# Patient Record
Sex: Male | Born: 2000 | Race: White | Hispanic: No | Marital: Single | Smoking: Never smoker
Health system: Southern US, Community
[De-identification: ages and names within clinical notes are randomized; demographics above are authoritative.]

## PROBLEM LIST (undated history)

## (undated) HISTORY — PX: HERNIA REPAIR: SHX51

## (undated) HISTORY — PX: TONSILLECTOMY: SUR1361

---
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
## 2019-11-14 ENCOUNTER — Encounter (HOSPITAL_COMMUNITY): Payer: Self-pay | Admitting: Emergency Medicine

## 2019-11-14 ENCOUNTER — Emergency Department (HOSPITAL_COMMUNITY): Payer: No Typology Code available for payment source

## 2019-11-14 ENCOUNTER — Emergency Department (HOSPITAL_COMMUNITY)
Admission: EM | Admit: 2019-11-14 | Discharge: 2019-11-15 | Disposition: A | Discharge status: Home / Self Care | Payer: No Typology Code available for payment source | Attending: Emergency Medicine | Admitting: Emergency Medicine

## 2019-11-14 DIAGNOSIS — R000 Tachycardia, unspecified: Secondary | ICD-10-CM | POA: Diagnosis not present

## 2019-11-14 DIAGNOSIS — M79651 Pain in right thigh: Secondary | ICD-10-CM | POA: Insufficient documentation

## 2019-11-14 DIAGNOSIS — R0789 Other chest pain: Secondary | ICD-10-CM | POA: Insufficient documentation

## 2019-11-14 DIAGNOSIS — M79604 Pain in right leg: Secondary | ICD-10-CM | POA: Diagnosis not present

## 2019-11-14 DIAGNOSIS — R109 Unspecified abdominal pain: Secondary | ICD-10-CM | POA: Diagnosis not present

## 2019-11-14 DIAGNOSIS — Z23 Encounter for immunization: Secondary | ICD-10-CM | POA: Diagnosis not present

## 2019-11-14 DIAGNOSIS — M79605 Pain in left leg: Secondary | ICD-10-CM | POA: Insufficient documentation

## 2019-11-14 DIAGNOSIS — S20212A Contusion of left front wall of thorax, initial encounter: Secondary | ICD-10-CM

## 2019-11-14 MED ORDER — FENTANYL CITRATE (PF) 100 MCG/2ML IJ SOLN
50.0000 ug | Freq: Once | INTRAMUSCULAR | Status: AC
  Administered 2019-11-15: 50 ug via INTRAVENOUS
  Filled 2019-11-14: qty 2

## 2019-11-14 MED ORDER — TETANUS-DIPHTH-ACELL PERTUSSIS 5-2.5-18.5 LF-MCG/0.5 IM SUSP
0.5000 mL | Freq: Once | INTRAMUSCULAR | Status: AC
  Administered 2019-11-15: 0.5 mL via INTRAMUSCULAR
  Filled 2019-11-14: qty 0.5

## 2019-11-14 MED ORDER — SODIUM CHLORIDE 0.9 % IV BOLUS
1000.0000 mL | Freq: Once | INTRAVENOUS | Status: AC
  Administered 2019-11-15: 1000 mL via INTRAVENOUS
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
## 2019-11-15 ENCOUNTER — Emergency Department (HOSPITAL_COMMUNITY): Payer: No Typology Code available for payment source

## 2019-11-15 DIAGNOSIS — R0789 Other chest pain: Secondary | ICD-10-CM | POA: Diagnosis not present

## 2019-11-15 LAB — CBC
HCT: 43.6 % (ref 39.0–52.0)
Hemoglobin: 14.1 g/dL (ref 13.0–17.0)
MCH: 29.3 pg (ref 26.0–34.0)
MCHC: 32.3 g/dL (ref 30.0–36.0)
MCV: 90.5 fL (ref 80.0–100.0)
Platelets: 366 10*3/uL (ref 150–400)
RBC: 4.82 MIL/uL (ref 4.22–5.81)
RDW: 12.9 % (ref 11.5–15.5)
WBC: 21.6 10*3/uL — ABNORMAL HIGH (ref 4.0–10.5)
nRBC: 0 % (ref 0.0–0.2)

## 2019-11-15 LAB — COMPREHENSIVE METABOLIC PANEL
ALT: 49 U/L — ABNORMAL HIGH (ref 0–44)
AST: 41 U/L (ref 15–41)
Albumin: 4.1 g/dL (ref 3.5–5.0)
Alkaline Phosphatase: 82 U/L (ref 38–126)
Anion gap: 9 (ref 5–15)
BUN: 13 mg/dL (ref 6–20)
CO2: 25 mmol/L (ref 22–32)
Calcium: 9.4 mg/dL (ref 8.9–10.3)
Chloride: 109 mmol/L (ref 98–111)
Creatinine, Ser: 0.94 mg/dL (ref 0.61–1.24)
GFR calc Af Amer: >60 mL/min (ref >60)
GFR calc non Af Amer: >60 mL/min (ref >60)
Glucose, Bld: 131 mg/dL — ABNORMAL HIGH (ref 70–99)
Potassium: 4 mmol/L (ref 3.5–5.1)
Sodium: 143 mmol/L (ref 135–145)
Total Bilirubin: 0.5 mg/dL (ref 0.3–1.2)
Total Protein: 6.8 g/dL (ref 6.5–8.1)

## 2019-11-15 LAB — I-STAT CHEM 8, ED
BUN: 14 mg/dL (ref 6–20)
Calcium, Ion: 1.14 mmol/L — ABNORMAL LOW (ref 1.15–1.40)
Chloride: 108 mmol/L (ref 98–111)
Creatinine, Ser: 0.7 mg/dL (ref 0.61–1.24)
Glucose, Bld: 129 mg/dL — ABNORMAL HIGH (ref 70–99)
HCT: 42 % (ref 39.0–52.0)
Hemoglobin: 14.3 g/dL (ref 13.0–17.0)
Potassium: 4 mmol/L (ref 3.5–5.1)
Sodium: 145 mmol/L (ref 135–145)
TCO2: 25 mmol/L (ref 22–32)

## 2019-11-15 MED ORDER — METHOCARBAMOL 500 MG PO TABS
500.0000 mg | ORAL_TABLET | Freq: Three times a day (TID) | ORAL | 0 refills | Status: DC | PRN
Start: 2019-11-15 — End: 2020-09-30

## 2019-11-15 MED ORDER — NAPROXEN 500 MG PO TABS
500.0000 mg | ORAL_TABLET | Freq: Two times a day (BID) | ORAL | 0 refills | Status: DC | PRN
Start: 2019-11-15 — End: 2020-09-30

## 2019-11-15 MED ORDER — SODIUM CHLORIDE 0.9 % IV BOLUS
1000.0000 mL | Freq: Once | INTRAVENOUS | Status: AC
  Administered 2019-11-15: 1000 mL via INTRAVENOUS

## 2019-11-15 MED ORDER — IOHEXOL 300 MG/ML  SOLN
100.0000 mL | Freq: Once | INTRAMUSCULAR | Status: AC | PRN
  Administered 2019-11-15: 100 mL via INTRAVENOUS

## 2019-11-15 MED ORDER — KETOROLAC TROMETHAMINE 15 MG/ML IJ SOLN
15.0000 mg | Freq: Once | INTRAMUSCULAR | Status: AC
  Administered 2019-11-15: 15 mg via INTRAVENOUS
  Filled 2019-11-15: qty 1
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
## 2019-11-15 NOTE — ED Notes (Signed)
Patient verbalizes understanding of discharge instructions. Opportunity for questioning and answers were provided. Pt discharged from ED. 

## 2020-03-12 ENCOUNTER — Other Ambulatory Visit: Payer: Self-pay

## 2020-03-12 ENCOUNTER — Ambulatory Visit (INDEPENDENT_AMBULATORY_CARE_PROVIDER_SITE_OTHER): Payer: Medicaid Other | Admitting: Behavioral Health

## 2020-03-12 DIAGNOSIS — F4311 Post-traumatic stress disorder, acute: Secondary | ICD-10-CM
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
## 2020-04-14 ENCOUNTER — Ambulatory Visit (INDEPENDENT_AMBULATORY_CARE_PROVIDER_SITE_OTHER): Payer: Medicaid Other | Admitting: Licensed Clinical Social Worker

## 2020-04-14 ENCOUNTER — Other Ambulatory Visit: Payer: Self-pay

## 2020-04-14 DIAGNOSIS — F4323 Adjustment disorder with mixed anxiety and depressed mood: Secondary | ICD-10-CM
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
## 2020-06-05 ENCOUNTER — Other Ambulatory Visit: Payer: Self-pay

## 2020-06-05 ENCOUNTER — Ambulatory Visit (INDEPENDENT_AMBULATORY_CARE_PROVIDER_SITE_OTHER): Payer: Medicaid Other | Admitting: Licensed Clinical Social Worker

## 2020-06-05 DIAGNOSIS — F4323 Adjustment disorder with mixed anxiety and depressed mood: Secondary | ICD-10-CM | POA: Diagnosis not present
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
## 2020-07-07 ENCOUNTER — Ambulatory Visit (HOSPITAL_COMMUNITY): Payer: Self-pay | Admitting: Licensed Clinical Social Worker

## 2020-08-11 ENCOUNTER — Ambulatory Visit (INDEPENDENT_AMBULATORY_CARE_PROVIDER_SITE_OTHER): Payer: Medicaid Other | Admitting: Licensed Clinical Social Worker

## 2020-08-11 ENCOUNTER — Other Ambulatory Visit: Payer: Self-pay

## 2020-08-11 DIAGNOSIS — F330 Major depressive disorder, recurrent, mild: Secondary | ICD-10-CM

## 2020-08-11 DIAGNOSIS — F4323 Adjustment disorder with mixed anxiety and depressed mood: Secondary | ICD-10-CM

## 2020-08-11 DIAGNOSIS — F411 Generalized anxiety disorder: Secondary | ICD-10-CM | POA: Diagnosis not present

## 2020-08-11 NOTE — Progress Notes (Signed)
   THERAPIST PROGRESS NOTE  Session Time: 58  Therapist Response:    Subjective/Objective: Pt was alert and oriented x 5. He was dressed casually and engaged well in therapy session. Pt presented with flat, slowed, and depressed mood/affect. He engaged well and was cooperative.   Pt started out session with pros/cons list he had created of why he wants to continue in therapy. Pt reports that there are more pros than cons and wants to continue. He stated he has a goal of being self-sufficient with the objective of obtaining the driver's license. He states that he has not been practicing since his last session which was assigned to him as a task to work on.    Assessment/Plan:  Pt endorse symptoms for worthlessness, fatigue, increase in appetite, tension, worry, and restlessness. He meets criteria for MDD and GAD. He is not taking medication currently. LCSW referred pt to Medication provider at [facility] on June 17th at 1pm. Plan to read driving book for passing driver test 1 x weekly, pt to walk 1 x weekly for 15 minutes, Pt to attempt 1 driving lesson in car with supervision 1 x in next 4 weeks  Participation Level: Active  Behavioral Response: Casual and Fairly GroomedAlertAnxious and Depressed  Type of Therapy: Individual Therapy  Treatment Goals addressed: Diagnosis: Adjustment disorder   Interventions: CBT and Supportive  Summary: Ralph [name] is a 20 y.o. male who presents with adjustment disorder.   Suicidal/Homicidal: NAwithout intent/plan    Plan: Return again in 6  weeks.      [name], LCSW 08/11/2020

## 2020-09-02 ENCOUNTER — Ambulatory Visit (HOSPITAL_COMMUNITY): Payer: Medicaid Other | Admitting: Licensed Clinical Social Worker

## 2020-09-18 ENCOUNTER — Ambulatory Visit (HOSPITAL_COMMUNITY): Payer: Medicaid Other | Admitting: Physician Assistant

## 2020-09-30 ENCOUNTER — Other Ambulatory Visit: Payer: Self-pay

## 2020-09-30 ENCOUNTER — Ambulatory Visit (INDEPENDENT_AMBULATORY_CARE_PROVIDER_SITE_OTHER): Payer: Medicaid Other | Admitting: Psychiatry

## 2020-09-30 ENCOUNTER — Encounter (HOSPITAL_COMMUNITY): Payer: Self-pay | Admitting: Psychiatry

## 2020-09-30 DIAGNOSIS — F330 Major depressive disorder, recurrent, mild: Secondary | ICD-10-CM | POA: Diagnosis not present

## 2020-09-30 DIAGNOSIS — F40248 Other situational type phobia: Secondary | ICD-10-CM | POA: Diagnosis not present

## 2020-09-30 MED ORDER — SERTRALINE HCL 50 MG PO TABS: ORAL_TABLET | ORAL | 1 refills | Status: DC
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
## 2020-10-06 ENCOUNTER — Ambulatory Visit (HOSPITAL_COMMUNITY): Payer: Self-pay | Admitting: Licensed Clinical Social Worker

## 2020-10-20 ENCOUNTER — Other Ambulatory Visit: Payer: Self-pay

## 2020-10-20 ENCOUNTER — Ambulatory Visit (INDEPENDENT_AMBULATORY_CARE_PROVIDER_SITE_OTHER): Payer: Medicaid Other | Admitting: Licensed Clinical Social Worker

## 2020-10-20 DIAGNOSIS — F330 Major depressive disorder, recurrent, mild: Secondary | ICD-10-CM | POA: Diagnosis not present
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
## 2020-11-19 ENCOUNTER — Other Ambulatory Visit: Payer: Self-pay

## 2020-11-19 ENCOUNTER — Encounter (HOSPITAL_COMMUNITY): Payer: Self-pay | Admitting: Physician Assistant

## 2020-11-19 ENCOUNTER — Ambulatory Visit (INDEPENDENT_AMBULATORY_CARE_PROVIDER_SITE_OTHER): Payer: Medicaid Other | Admitting: Physician Assistant

## 2020-11-19 VITALS — BP 111/70 | HR 77 | Ht 72.0 in | Wt 300.0 lb

## 2020-11-19 DIAGNOSIS — F40248 Other situational type phobia: Secondary | ICD-10-CM

## 2020-11-19 DIAGNOSIS — F330 Major depressive disorder, recurrent, mild: Secondary | ICD-10-CM | POA: Diagnosis not present

## 2020-11-19 NOTE — Progress Notes (Signed)
BH MD/PA/NP OP Progress Note  01/01/2021 10:54 PM [name]  MRN:  [id number]  Chief Complaint:  Chief Complaint   Medication Management   Follow up and medication management  HPI:   Ralph [name]   Visit Diagnosis:    ICD-10-CM   1. Other situational type phobia  F40.248     2. Major depressive disorder, recurrent episode, mild (HCC)  F33.0       Past Psychiatric History:  Major depressive disorder Other situational phobia  Past Medical History: No past medical history on file.  Past Surgical History:  Procedure Laterality Date   HERNIA REPAIR     age2   TONSILLECTOMY     age 41    Family Psychiatric History:  Denied  Family History: No family history on file.  Social History:  Social History   Socioeconomic History   Marital status: Single    Spouse name: Not on file   Number of children: Not on file   Years of education: Not on file   Highest education level: Not on file  Occupational History   Not on file  Tobacco Use   Smoking status: Never   Smokeless tobacco: Never  Substance and Sexual Activity   Alcohol use: No   Drug use: No   Sexual activity: Not on file  Other Topics Concern   Not on file  Social History Narrative   Not on file   Social Determinants of Health   Financial Resource Strain: Low Risk    Difficulty of Paying Living Expenses: Not very hard  Food Insecurity: No Food Insecurity   Worried About Running Out of Food in the Last Year: Never true   Ran Out of Food in the Last Year: Never true  Transportation Needs: No Transportation Needs   Lack of Transportation (Medical): No   Lack of Transportation (Non-Medical): No  Physical Activity: Insufficiently Active   Days of Exercise per Week: 2 days   Minutes of Exercise per Session: 20 min  Stress: No Stress Concern Present   Feeling of Stress : Only a little  Social Connections: Socially Isolated   Frequency of Communication with Friends and Family: Twice a week    Frequency of Social Gatherings with Friends and Family: Once a week   Attends Religious Services: Never   Database administrator or Organizations: No   Attends Banker Meetings: Never   Marital Status: Never married    Allergies:  Allergies  Allergen Reactions   Apple Diarrhea and Rash    Apple juice    Bean Pod Extract Diarrhea, Rash and Other (See Comments)    Green beans    Metabolic Disorder Labs: No results found for: HGBA1C, MPG No results found for: PROLACTIN No results found for: CHOL, TRIG, HDL, CHOLHDL, VLDL, LDLCALC No results found for: TSH  Therapeutic Level Labs: No results found for: LITHIUM No results found for: VALPROATE No components found for:  CBMZ  Current Medications: Current Outpatient Medications  Medication Sig Dispense Refill   cetirizine (ZYRTEC) 10 MG tablet Take 10 mg by mouth daily.     sertraline (ZOLOFT) 50 MG tablet TAKE 1/2 (ONE-HALF) TABLET BY MOUTH IN THE MORNING FOR  1  WEEK  THEN  TAKE  1  TABLET IN THE MORNING 30 tablet 0   No current facility-administered medications for this visit.     Musculoskeletal: Strength & Muscle Tone: within normal limits Gait & Station: normal Patient leans: N/A  Psychiatric Specialty  Exam: Review of Systems  Psychiatric/Behavioral:  Negative for decreased concentration, dysphoric mood, hallucinations, self-injury, sleep disturbance and suicidal ideas. The patient is nervous/anxious. The patient is not hyperactive.    Blood pressure 111/70, pulse 77, height 6' (1.829 m), weight 300 lb (136.1 kg).Body mass index is 40.69 kg/m.  General Appearance: Well Groomed  Eye Contact:  Good  Speech:  Clear and Coherent and Normal Rate  Volume:  Normal  Mood:  Anxious and Euthymic  Affect:  Appropriate and Congruent  Thought Process:  Coherent and Descriptions of Associations: Intact  Orientation:  Full (Time, Place, and Person)  Thought Content: WDL   Suicidal Thoughts:  No  Homicidal  Thoughts:  No  Memory:  Immediate;   Good Recent;   Good Remote;   Good  Judgement:  Good  Insight:  Fair  Psychomotor Activity:  Normal  Concentration:  Concentration: Good and Attention Span: Good  Recall:  Good  Fund of Knowledge: Good  Language: Good  Akathisia:  Negative  Handed:  Right  AIMS (if indicated): not done  Assets:  Communication Skills Desire for Improvement Financial Resources/Insurance Housing Physical Health Social Support Vocational/Educational  ADL's:  Intact  Cognition: WNL  Sleep:  Good   Screenings: GAD-7    Flowsheet Row Clinical Support from 11/19/2020 in [facility] Office Visit from 09/30/2020 in [facility] Counselor from 08/11/2020 in [facility] Office Visit from 09/25/2017 in [facility] Office Visit from 09/04/2017 in [facility]  Total GAD-7 Score 8 2 8 8 6       PHQ2-9    Flowsheet Row Clinical Support from 11/19/2020 in [facility] Office Visit from 09/30/2020 in [facility] Counselor from 08/11/2020 in [facility] Office Visit from 09/25/2017 in [facility] Office Visit from 09/04/2017 in [facility]  PHQ-2 Total Score 2 3 2 3 3   PHQ-9 Total Score 5 8 6 6 6       Flowsheet Row Clinical Support from 11/19/2020 in [facility] Office Visit from 09/30/2020 in [facility] Counselor from 08/11/2020 in [facility]  C-SSRS RISK CATEGORY No Risk No Risk No Risk        Assessment and Plan:     1. Other situational type phobia Patient to continue taking sertraline 50 mg daily for the management of his situational phobia  2. Major depressive disorder, recurrent episode, mild (HCC) Patient to continue taking  sertraline 50 mg daily for the management of his major depressive disorder  Patient to follow up in 2 months Provider spent a total of 20 minutes with the patient/reviewing patient's chart  [facility], PA 01/01/2021, 10:54 PM

## 2020-12-15 ENCOUNTER — Other Ambulatory Visit: Payer: Self-pay

## 2020-12-15 ENCOUNTER — Ambulatory Visit (INDEPENDENT_AMBULATORY_CARE_PROVIDER_SITE_OTHER): Payer: Medicaid Other | Admitting: Licensed Clinical Social Worker

## 2020-12-15 DIAGNOSIS — F330 Major depressive disorder, recurrent, mild: Secondary | ICD-10-CM | POA: Diagnosis not present

## 2020-12-15 DIAGNOSIS — F411 Generalized anxiety disorder: Secondary | ICD-10-CM

## 2020-12-15 NOTE — Progress Notes (Signed)
   THERAPIST PROGRESS NOTE  Session Time: 11  Participation Level: Active  Behavioral Response: Casual and Fairly GroomedAlertAnxious and Depressed  Type of Therapy: Individual Therapy  Treatment Goals addressed: Diagnosis: MDD and GAD  Interventions: Motivational Interviewing and Supportive  Summary: Ralph [name] is a 20 y.o. male who presents with depressed and anxious mood\affect.  Patient was pleasant, cooperative, and maintained good eye contact.  [name] was alert and oriented x5.  He was dressed casually and was well-groomed.  Primary stressors for patient are family illness, driving, and work.  Patient started off session by stating that his mother recently got surgery on her hammertoe.  He reports that this has been a struggle as his mom has recovering she has not been working.  [name] states that this is his main source for driving as he still is not able to drive.  LCSW inquired about his driving lessons.  He states that he has not been able to practice in the last 60 to 90 days since seeing LCSW last.  LCSW utilized motivational interviewing and supportive therapy for encouragement, praise, and motivation.  This is helped patient realized that he has the ability and not the inability to drive.  LCSW stated "you missed 100% of the shots that you do not take" and "failures or your lessons for your successes".  [name] reported relief from exercise stating he came in at about a 5 and is leaving and a 7.   [name] reports that work is also been a stressor for him.  He reports that a lot of kids have been starting to go back to school this puts more stress on him and more responsibility on him for his work duties.  This is due to being short staffed.  LCSW view worked with patient about focusing on what we can control versus what we cannot control.  LCSW had patient write down his job duties and told him to focus on what was job duties along as that is what he is hired to  do.  Suicidal/Homicidal: Nowithout intent/plan  Therapist Response:    Intervention/Plan: LCSW spoke to patient about missing appointments as he has missed 2.  LCSW encourage patient and praised him to maintain appointments and also coming back after missing 2 appointments.  LCSW utilized motivational interviewing, cognitive behavioral therapy and supportive therapy.  Open-ended questions were utilized.  Unconditional positive regard was utilized.  Praise and support was also utilized in today's session.  Plan for patient moving forward is to follow-up with LCSW every 4 weeks.  Patient to practice driving at least 1 time per month.  Patient to write down 3 positive affirmations 1 time per week and utilize them when he feels discouraged.  Treatment plan updated today to reflect new plan.   Plan: Return again in 4 weeks.      [name], LCSW 12/15/2020

## 2020-12-29 ENCOUNTER — Other Ambulatory Visit (HOSPITAL_COMMUNITY): Payer: Self-pay | Admitting: Psychiatry

## 2020-12-29 DIAGNOSIS — F40248 Other situational type phobia: Secondary | ICD-10-CM

## 2020-12-29 DIAGNOSIS — F330 Major depressive disorder, recurrent, mild: Secondary | ICD-10-CM

## 2021-01-01 ENCOUNTER — Encounter (HOSPITAL_COMMUNITY): Payer: Self-pay | Admitting: Physician Assistant

## 2021-01-19 ENCOUNTER — Ambulatory Visit (INDEPENDENT_AMBULATORY_CARE_PROVIDER_SITE_OTHER): Payer: Medicaid Other | Admitting: Licensed Clinical Social Worker

## 2021-01-19 ENCOUNTER — Encounter (HOSPITAL_COMMUNITY): Payer: Self-pay | Admitting: Physician Assistant

## 2021-01-19 ENCOUNTER — Other Ambulatory Visit: Payer: Self-pay

## 2021-01-19 ENCOUNTER — Ambulatory Visit (INDEPENDENT_AMBULATORY_CARE_PROVIDER_SITE_OTHER): Payer: Medicaid Other | Admitting: Physician Assistant

## 2021-01-19 DIAGNOSIS — F40248 Other situational type phobia: Secondary | ICD-10-CM | POA: Diagnosis not present

## 2021-01-19 DIAGNOSIS — F330 Major depressive disorder, recurrent, mild: Secondary | ICD-10-CM

## 2021-01-19 DIAGNOSIS — F411 Generalized anxiety disorder: Secondary | ICD-10-CM

## 2021-01-19 MED ORDER — SERTRALINE HCL 50 MG PO TABS: 50.0000 mg | ORAL_TABLET | Freq: Every day | ORAL | 2 refills | Status: DC
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
## 2021-02-16 ENCOUNTER — Ambulatory Visit (HOSPITAL_COMMUNITY): Payer: Medicaid Other | Admitting: Licensed Clinical Social Worker

## 2021-03-15 ENCOUNTER — Telehealth (HOSPITAL_COMMUNITY): Payer: Self-pay | Admitting: Licensed Clinical Social Worker

## 2021-03-15 NOTE — Telephone Encounter (Signed)
LCSW called to confirm appt and pt confirmed f/u appt for 10am

## 2021-03-16 ENCOUNTER — Other Ambulatory Visit: Payer: Self-pay

## 2021-03-16 ENCOUNTER — Ambulatory Visit (INDEPENDENT_AMBULATORY_CARE_PROVIDER_SITE_OTHER): Payer: Medicaid Other | Admitting: Licensed Clinical Social Worker

## 2021-03-16 DIAGNOSIS — F411 Generalized anxiety disorder: Secondary | ICD-10-CM
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
## 2021-04-12 ENCOUNTER — Ambulatory Visit (INDEPENDENT_AMBULATORY_CARE_PROVIDER_SITE_OTHER): Payer: Medicaid Other | Admitting: Licensed Clinical Social Worker

## 2021-04-12 ENCOUNTER — Other Ambulatory Visit: Payer: Self-pay

## 2021-04-12 DIAGNOSIS — F411 Generalized anxiety disorder: Secondary | ICD-10-CM

## 2021-04-12 DIAGNOSIS — F330 Major depressive disorder, recurrent, mild: Secondary | ICD-10-CM
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
## 2021-04-20 ENCOUNTER — Other Ambulatory Visit: Payer: Self-pay

## 2021-04-20 ENCOUNTER — Encounter (HOSPITAL_COMMUNITY): Payer: Self-pay | Admitting: Family

## 2021-04-20 ENCOUNTER — Ambulatory Visit (HOSPITAL_COMMUNITY): Payer: Medicaid Other | Admitting: Family

## 2021-04-20 VITALS — BP 139/66 | HR 84 | Ht 73.0 in | Wt 318.0 lb

## 2021-04-20 DIAGNOSIS — F4323 Adjustment disorder with mixed anxiety and depressed mood: Secondary | ICD-10-CM

## 2021-04-20 DIAGNOSIS — F411 Generalized anxiety disorder: Secondary | ICD-10-CM

## 2021-04-20 DIAGNOSIS — F40248 Other situational type phobia: Secondary | ICD-10-CM

## 2021-04-20 DIAGNOSIS — F330 Major depressive disorder, recurrent, mild: Secondary | ICD-10-CM

## 2021-04-20 MED ORDER — SERTRALINE HCL 50 MG PO TABS: 50.0000 mg | ORAL_TABLET | Freq: Every day | ORAL | 0 refills | Status: DC

## 2021-04-20 NOTE — Progress Notes (Signed)
[place] MD/PA/NP OP Progress Note  04/20/2021 10:55 AM Ralph [name]  MRN:  [id number]  Chief Complaint:  [name] reported "  I am feeling really good."   HPI:  Ralph [name] was seen and evaluated face-to-face for medication management.  He has a charted history of major depressive disorder generalized anxiety disorder and adjustment disorder.  Currently he is prescribed Zoloft 50 mg mg daily which he reports taking and tolerating well.  Denying any suicidal or homicidal ideations.  Denies auditory or visual hallucinations.  Rates his depression 3 out of 10 with 10 being the worst.  States this is the best he is felt in a while.  States he is currently unemployed by a Social worker. 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  Reports he and his family are in the midst of buying a new home to be closer to their grandmother.  Patient reports he is excited to move to the new neighborhood.  Denies any other distressing symptoms.  He reports a good appetite.  States he is resting well throughout the night.  Patient to follow-up 3 months for medication management.  Support, encouragement  and reassurance was provided.  Visit Diagnosis:    ICD-10-CM   1. Major depressive disorder, recurrent episode, mild (HCC)  F33.0     2. Adjustment disorder with mixed anxiety and depressed mood  F43.23     3. GAD (generalized anxiety disorder)  F41.1       Past Psychiatric History:   Past Medical History: No past medical history on file.  Past Surgical History:  Procedure Laterality Date   HERNIA REPAIR     age2   TONSILLECTOMY     age 23    Family Psychiatric History:   Family History: No family history on file.  Social History:  Social History   Socioeconomic History   Marital status: Single    Spouse name: Not on file   Number of children: Not on file   Years of education: Not on file   Highest education level: Not on file  Occupational  History   Not on file  Tobacco Use   Smoking status: Never   Smokeless tobacco: Never  Substance and Sexual Activity   Alcohol use: No   Drug use: No   Sexual activity: Not on file  Other Topics Concern   Not on file  Social History Narrative   Not on file   Social Determinants of Health   Financial Resource Strain: Low Risk    Difficulty of Paying Living Expenses: Not very hard  Food Insecurity: No Food Insecurity   Worried About Running Out of Food in the Last Year: Never true   Ran Out of Food in the Last Year: Never true  Transportation Needs: No Transportation Needs   Lack of Transportation (Medical): No   Lack of Transportation (Non-Medical): No  Physical Activity: Insufficiently Active   Days of Exercise per Week: 2 days   Minutes of Exercise per Session: 20 min  Stress: No Stress Concern Present   Feeling of Stress : Only a little  Social Connections: Socially Isolated   Frequency of Communication with Friends and Family: Twice a week   Frequency of Social Gatherings with Friends and Family: Once a week   Attends Religious Services: Never   Marine scientist or Organizations: No   Attends Archivist Meetings: Never   Marital Status: Never married  Allergies:  Allergies  Allergen Reactions   Apple Diarrhea and Rash    Apple juice    Bean Pod Extract Diarrhea, Rash and Other (See Comments)    Green beans    Metabolic Disorder Labs: No results found for: HGBA1C, MPG No results found for: PROLACTIN No results found for: CHOL, TRIG, HDL, CHOLHDL, VLDL, LDLCALC No results found for: TSH  Therapeutic Level Labs: No results found for: LITHIUM No results found for: VALPROATE No components found for:  CBMZ  Current Medications: Current Outpatient Medications  Medication Sig Dispense Refill   cetirizine (ZYRTEC) 10 MG tablet Take 10 mg by mouth daily.     sertraline (ZOLOFT) 50 MG tablet Take 1 tablet (50 mg total) by mouth daily. 30 tablet  2   No current facility-administered medications for this visit.     Musculoskeletal: Strength & Muscle Tone: within normal limits Gait & Station: normal Patient leans: N/A  Psychiatric Specialty Exam: Review of Systems  Cardiovascular: Negative.   Musculoskeletal: Negative.   Psychiatric/Behavioral:  The patient is nervous/anxious.   All other systems reviewed and are negative.  There were no vitals taken for this visit.There is no height or weight on file to calculate BMI.  General Appearance: Casual  Eye Contact:  Good  Speech:  Clear and Coherent  Volume:  Normal  Mood:  Depressed  Affect:  Congruent  Thought Process:  Coherent  Orientation:  Full (Time, Place, and Person)  Thought Content: Logical   Suicidal Thoughts:  No  Homicidal Thoughts:  No  Memory:  Immediate;   Good Recent;   Good  Judgement:  Good  Insight:  Good  Psychomotor Activity:  Normal  Concentration:  Concentration: Good  Recall:  Good  Fund of Knowledge: Good  Language: Good  Akathisia:  No  Handed:  Right  AIMS (if indicated): done  Assets:  Communication Skills Desire for Improvement Social Support  ADL's:  Intact  Cognition: WNL  Sleep:  Fair   Screenings: GAD-7    Health and safety inspector from 04/12/2021 in [facility] Counselor from 03/16/2021 in [facility] Counselor from 01/19/2021 in [facility] Counselor from 12/15/2020 in [place] from 11/19/2020 in [facility]  Total GAD-7 Score 3 8 5 7 8       PHQ2-9    Flowsheet Row Counselor from 04/12/2021 in [facility] Counselor from 03/16/2021 in [facility] Counselor from 01/19/2021 in [facility] Counselor from 12/15/2020 in [place] from  11/19/2020 in [facility]  PHQ-2 Total Score 0 1 2 2 2   PHQ-9 Total Score 0 4 5 9 5       Flowsheet Row Counselor from 03/16/2021 in [facility] Office Visit from 01/19/2021 in [place] from 11/19/2020 in [place] No Risk No Risk No Risk        Assessment and Plan:  [name] is a 21 year old Caucasian male that presents with a history of adjustment disorder with mixed depression and anxiety.  Reporting overall his mood has been stabilized with medications.  States he initially started taking medication due to the passing of his grandfather while in his senior year in high school.  Reports he has been compliant and tolerating Zoloft well. He  denied illicit drug use or substance abuse history.  Patient to follow-up 3 months for medication management support encouragement reassurance was provided.  Adjustment disorder: Major depressive disorder: Generalized anxiety disorder:  Continue Zoloft 50 mg p.o. daily  Patient to follow-up 3 months for medication management   Derrill [place], NP 04/20/2021, 10:55 AM

## 2021-04-28 ENCOUNTER — Ambulatory Visit (INDEPENDENT_AMBULATORY_CARE_PROVIDER_SITE_OTHER): Payer: Medicaid Other | Admitting: Licensed Clinical Social Worker

## 2021-04-28 ENCOUNTER — Other Ambulatory Visit: Payer: Self-pay

## 2021-04-28 DIAGNOSIS — F411 Generalized anxiety disorder: Secondary | ICD-10-CM | POA: Diagnosis not present
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
## 2021-05-26 ENCOUNTER — Encounter (HOSPITAL_COMMUNITY): Payer: Self-pay

## 2021-05-26 ENCOUNTER — Ambulatory Visit (HOSPITAL_COMMUNITY): Payer: Medicaid Other | Admitting: Licensed Clinical Social Worker

## 2021-06-29 ENCOUNTER — Ambulatory Visit (INDEPENDENT_AMBULATORY_CARE_PROVIDER_SITE_OTHER): Payer: Medicaid Other | Admitting: Licensed Clinical Social Worker

## 2021-06-29 ENCOUNTER — Other Ambulatory Visit: Payer: Self-pay

## 2021-06-29 ENCOUNTER — Encounter (HOSPITAL_COMMUNITY): Payer: Self-pay

## 2021-06-29 DIAGNOSIS — F330 Major depressive disorder, recurrent, mild: Secondary | ICD-10-CM

## 2021-06-29 DIAGNOSIS — F411 Generalized anxiety disorder: Secondary | ICD-10-CM

## 2021-06-29 NOTE — Plan of Care (Signed)
Pt agreeable to plan  ?

## 2021-06-29 NOTE — Progress Notes (Signed)
? ?  THERAPIST PROGRESS NOTE ? ?Session Time: 40 ? ?Participation Level: Active ? ?Behavioral Response: CasualAlertAnxious and Depressed ? ?Type of Therapy: Individual Therapy ? ?Treatment Goals addressed: GAD-7 below 5 and PHQ-9 below 10  ? ?ProgressTowards Goals: Progressing ? ?Interventions: CBT, Motivational Interviewing, and Supportive ? ?Summary: Ralph [name] is a 21 y.o. male who presents with depressed and anxious mood\affect.  Patient was pleasant, cooperative, maintained good eye contact.  he engaged well in therapy session and was dressed casually. ? Reports primary stressors as financials, and housing.  Patient states that he has moved into new housing and that is why he canceled his last appointment as they were in the process of moving to [employer] which is still in [place].  Patient reports that there have been some updates to the house that they have been making and patient is responsible for a portion of the bills.  Patient reports that he has been stressed with financials but is unaware of all that he is responsible for as far as his responsibility towards the bills.  LCSW advised patient to sit down with his parents to talk about his responsibilities so that he can make a proper budget. ? ?Suicidal/Homicidal: Nowithout intent/plan ? ?Therapist Response:  ? ? Intervention/Plan: LCSW administered a PHQ-9 and GAD-7.  LCSW notes that patient has decreased both PHQ-9 and GAD-7 below a 5 for the past 2 sessions over the last 3 months.  Patient's goal\objective is below a 10 and a 5 respectively for PHQ-9 and GAD-7.  Interventions utilized in today's session were for supportive therapy for praise and encouragement.  LCSW used cognitive behavioral therapy to educate patient on budgeting utilizing the "50, 30, 20 rule".  This is a budgeting plan to spend 50% and needs, 30% on once, and 20% on savings.  Plan for patient is to bring and at least 3 things he would like to work on moving forward for  therapy.  ? ?Plan: Return again in 4 weeks. ? ?Diagnosis: No diagnosis found. ? ?Collaboration of Care:  ?Other None in todays session  ?Patient/Guardian was advised Release of Information must be obtained prior to any record release in order to collaborate their care with an outside provider. Patient/Guardian was advised if they have not already done so to contact the registration department to sign all necessary forms in order for [place] to release information regarding their care.  ? ?Consent: Patient/Guardian gives verbal consent for treatment and assignment of benefits for services provided during this visit. Patient/Guardian expressed understanding and agreed to proceed.  ? ?Dory Horn, LCSW ?06/29/2021 ? ?

## 2021-07-13 ENCOUNTER — Encounter (HOSPITAL_COMMUNITY): Payer: Self-pay | Admitting: Physician Assistant

## 2021-07-13 ENCOUNTER — Ambulatory Visit (INDEPENDENT_AMBULATORY_CARE_PROVIDER_SITE_OTHER): Payer: Medicaid Other | Admitting: Physician Assistant

## 2021-07-13 DIAGNOSIS — F40248 Other situational type phobia: Secondary | ICD-10-CM

## 2021-07-13 DIAGNOSIS — F330 Major depressive disorder, recurrent, mild: Secondary | ICD-10-CM

## 2021-07-13 MED ORDER — SERTRALINE HCL 50 MG PO TABS: 50.0000 mg | ORAL_TABLET | Freq: Every day | ORAL | 3 refills | Status: DC
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
## 2021-08-16 ENCOUNTER — Ambulatory Visit (INDEPENDENT_AMBULATORY_CARE_PROVIDER_SITE_OTHER): Payer: Medicaid Other | Admitting: Licensed Clinical Social Worker

## 2021-08-16 DIAGNOSIS — F330 Major depressive disorder, recurrent, mild: Secondary | ICD-10-CM

## 2021-08-16 DIAGNOSIS — F411 Generalized anxiety disorder: Secondary | ICD-10-CM

## 2021-08-16 NOTE — Progress Notes (Signed)
? ?  THERAPIST PROGRESS NOTE ? ?Session Time: 45 ? ?Participation Level: Active ? ?Behavioral Response: CasualAlertAnxious and Depressed ? ?Type of Therapy: Individual Therapy ? ?Treatment Goals addressed: [name] WILL ATTEND AT LEAST 80% OF SCHEDULED GROUP PSYCHOTHERAPY  ?SESSIONS ? ?ProgressTowards Goals: Progressing ? ?Interventions: CBT, Motivational Interviewing, and Supportive ? ?Summary: Ralph [name] is a 21 y.o. male who presents with depressed and anxious mood\affect.  Patient was pleasant, cooperative, maintained good eye contact.  He was alert and oriented x5. ? Comes in today with his grandmother.  Patient was agreeable to grandmother in session.  Grandmother started out session stating "I am concerned with his over all drive".  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  Patient reports that he understands grandma's concerns, however does not know how to address them.  LCSW attempted to bridge By mediating the situation.  Discussion was pleasant and progressive.  A close the gap between grandmas viewpoints and patient's viewpoints, as reported by both grandma and patient. ? ?Suicidal/Homicidal: Nowithout intent/plan ? ?Therapist Response:  ? ? Intervention/Plan: Plan for patient is referral to [employer] for support groups for mental health.  Patient to bring in 3 careers or job that interest him into next session. Pt also wants to discuss budgeting financials. LCSW administered motivational interview for positive affirmations, reflective listening, and open ended question. LCSW educated pt on the importance on maintaining medication regiment and therapy session as scheduled. LCSW used supportive therapy for praise and encouragement.  ? ?Plan: Return again in 4 weeks. ? ?Diagnosis: Major depressive disorder, recurrent episode, mild ([place]) ? ?GAD (generalized anxiety disorder) ? ?Collaboration of Care: Other None in  todays session.    ?     ?Patient/Guardian was advised Release of Information must be obtained prior to any record release in order to collaborate their care with an outside provider. Patient/Guardian was advised if they have not already done so to contact the registration department to sign all necessary forms in order for [place] to release information regarding their care.  ? ?Consent: Patient/Guardian gives verbal consent for treatment and assignment of benefits for services provided during this visit. Patient/Guardian expressed understanding and agreed to proceed.  ? ?Dory Horn, LCSW ?08/16/2021 ? ?

## 2021-08-16 NOTE — Plan of Care (Signed)
?  Problem: Depression CCP Problem  1 Adjustment disorder  ?Goal: STG: Ralph [name] WILL ATTEND AT LEAST 80% OF SCHEDULED GROUP PSYCHOTHERAPY SESSIONS ?Outcome: Progressing ?Goal: STG: [name] WILL COMPLETE AT LEAST 80% OF ASSIGNED HOMEWORK ?Outcome: Progressing ?  ?Problem: Depression CCP Problem  1 Adjustment disorder  ?Goal:  walk 3 x weekly  ?Outcome: Not Progressing ?Goal: Decrease GAD-7 below 5  ?Outcome: Not Progressing ?Goal: LTG: Ralph [name] WILL SCORE LESS THAN 10 ON THE PATIENT HEALTH QUESTIONNAIRE (PHQ-9) ?Outcome: Not Progressing ?  ?Problem: Depression CCP Problem  1 Adjustment disorder  ?Intervention: WORK WITH Ralph [name] TO TRACK SYMPTOMS, TRIGGERS AND/OR SKILL USE THROUGH A MOOD CHART, DIARY CARD, OR JOURNAL ?Intervention: Ralph [name] TO PARTICIPATE IN RECOV[employer] PEER SUPPORT ACTIVITIES WEEKLY ?Intervention: PROVIDE Ralph [name] WITH EDUCATIONAL INFORMATION AND READING MATERIAL ON DISSOCIATION, ITS CAUSES, AND SYMPTOMS ?  ?

## 2021-08-26 ENCOUNTER — Telehealth (HOSPITAL_COMMUNITY): Payer: Self-pay | Admitting: Licensed Clinical Social Worker

## 2021-08-26 NOTE — Telephone Encounter (Signed)
LCSW called patient as he left message for administrative staff for referral to [employer].  LCSW recent referral and provided patient with phone number to follow-up Monday afternoon if no callback is received by then.

## 2021-09-07 ENCOUNTER — Encounter: Payer: Self-pay | Admitting: *Deleted

## 2021-09-14 ENCOUNTER — Ambulatory Visit (INDEPENDENT_AMBULATORY_CARE_PROVIDER_SITE_OTHER): Payer: Medicaid Other | Admitting: Licensed Clinical Social Worker

## 2021-09-14 DIAGNOSIS — F330 Major depressive disorder, recurrent, mild: Secondary | ICD-10-CM

## 2021-09-14 DIAGNOSIS — F411 Generalized anxiety disorder: Secondary | ICD-10-CM

## 2021-09-14 NOTE — Progress Notes (Signed)
   THERAPIST PROGRESS NOTE  Session Time: 45  Participation Level: Active  Behavioral Response: CasualAlertAnxious and Depressed  Type of Therapy: Individual Therapy  Treatment Goals addressed: Decrease PHQ-9 and GAD-7 below 5 and 10   ProgressTowards Goals: Met  Interventions: CBT, Motivational Interviewing, and Strength-based  Summary: Ralph [name] is a 21 y.o. male who presents with depressed and anxious mood\affect.  Patient was pleasant, cooperative, maintained good eye contact.  He engaged well in therapy session and was dressed casually.   Patient reports primary stressors as work, family conflict, and financials.  Patient reports that he has been being proactive about searching for further opportunities in his life for financials and work.  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  Patient reports that he believes there is a second interview but has not gotten a call back as of today.  Patient reports optimism due to the fact that his interview was conducted yesterday.  LCSW advocated for patient to follow-up with interviewing manager to show appreciation for the opportunity to sit down and speak about the position.  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  Patient reports that he did receive a call back in regards to referral sent out last session.  Patient states that there were forms that were needed to be completed and they were completed and sent back last week Monday.  LCSW encouraged patient to follow-up with referral this week.    Suicidal/Homicidal: Nowithout intent/plan  Therapist Response:    Interventions utilized in today's session were for solution focused for resources to [employer] works for employment opportunities.  LCSW used unconditional positive regard, words of affirmation, positive reinforcement, and  reflective listening in today's session.  LCSW utilized supportive therapy for praise and encouragement.  LCSW used motivational interviewing for open-ended questions.  Plan: Return again in 4 weeks.  Diagnosis: Major depressive disorder, recurrent episode, mild (HCC)  GAD (generalized anxiety disorder)  Collaboration of Care: Other Pt to f/u with [employer] for Support Groups.   Patient/Guardian was advised Release of Information must be obtained prior to any record release in order to collaborate their care with an outside provider. Patient/Guardian was advised if they have not already done so to contact the registration department to sign all necessary forms in order for [place] to release information regarding their care.   Consent: Patient/Guardian gives verbal consent for treatment and assignment of benefits for services provided during this visit. Patient/Guardian expressed understanding and agreed to proceed.   Dory Horn, LCSW 09/14/2021

## 2021-09-14 NOTE — Plan of Care (Signed)
  Problem: Depression CCP Problem  1 Adjustment disorder  Goal:  walk 3 x weekly  Outcome: Progressing Goal: STG: Ralph [name] WILL COMPLETE AT LEAST 80% OF ASSIGNED HOMEWORK Outcome: Progressing   Problem: Depression CCP Problem  1 Adjustment disorder  Goal: STG: Ralph [name] WILL ATTEND AT LEAST 80% OF SCHEDULED GROUP PSYCHOTHERAPY SESSIONS Outcome: Not Progressing   Problem: Depression CCP Problem  1 Adjustment disorder  Goal: Decrease GAD-7 below 5  Outcome: Completed/Met Goal: LTG: Ralph [name] WILL SCORE LESS THAN 10 ON THE PATIENT HEALTH QUESTIONNAIRE (PHQ-9) Outcome: Completed/Met   Problem: Depression CCP Problem  1 Adjustment disorder  Intervention: Administer the PHQ-9 or MADRS weekly for 4 weeks Intervention: WORK WITH Ralph [name] TO IDENTIFY THE MAJOR COMPONENTS OF A RECENT EPISODE OF DEPRESSION: PHYSICAL SYMPTOMS, MAJOR THOUGHTS AND IMAGES, AND MAJOR BEHAVIORS THEY EXPERIENCED

## 2021-10-06 ENCOUNTER — Ambulatory Visit (HOSPITAL_COMMUNITY): Payer: Medicaid Other | Admitting: Licensed Clinical Social Worker

## 2021-10-12 ENCOUNTER — Encounter (HOSPITAL_COMMUNITY): Payer: Medicaid Other | Admitting: Physician Assistant

## 2021-10-18 ENCOUNTER — Encounter (HOSPITAL_COMMUNITY): Payer: Self-pay | Admitting: Student in an Organized Health Care Education/Training Program

## 2021-10-18 ENCOUNTER — Ambulatory Visit (INDEPENDENT_AMBULATORY_CARE_PROVIDER_SITE_OTHER): Payer: Medicaid Other | Admitting: Student in an Organized Health Care Education/Training Program

## 2021-10-18 DIAGNOSIS — F40248 Other situational type phobia: Secondary | ICD-10-CM | POA: Diagnosis not present

## 2021-10-18 DIAGNOSIS — F330 Major depressive disorder, recurrent, mild: Secondary | ICD-10-CM

## 2021-10-18 MED ORDER — SERTRALINE HCL 50 MG PO TABS: 50.0000 mg | ORAL_TABLET | Freq: Every day | ORAL | 3 refills | Status: AC
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
## 2021-11-02 ENCOUNTER — Ambulatory Visit (INDEPENDENT_AMBULATORY_CARE_PROVIDER_SITE_OTHER): Payer: Medicaid Other | Admitting: Licensed Clinical Social Worker

## 2021-11-02 DIAGNOSIS — F330 Major depressive disorder, recurrent, mild: Secondary | ICD-10-CM

## 2021-11-02 DIAGNOSIS — F411 Generalized anxiety disorder: Secondary | ICD-10-CM

## 2021-11-02 NOTE — Plan of Care (Signed)
  Problem: Depression CCP Problem  1 Adjustment disorder  Goal: STG: Kayd WILL COMPLETE AT LEAST 80% OF ASSIGNED HOMEWORK Outcome: Progressing   Problem: Depression CCP Problem  1 Adjustment disorder  Goal:  walk 3 x weekly  Outcome: Not Progressing Goal: STG: Ikey WILL ATTEND AT LEAST 80% OF SCHEDULED GROUP PSYCHOTHERAPY SESSIONS Outcome: Not Progressing

## 2021-11-02 NOTE — Progress Notes (Signed)
   THERAPIST PROGRESS NOTE  Session Time: 49   Participation Level: Active  Behavioral Response: CasualAlertAnxious and Depressed  Type of Therapy: Individual Therapy  Treatment Goals addressed: walk 3 x weekly    ProgressTowards Goals: Progressing  Interventions: CBT, Motivational Interviewing, and Supportive  Summary: Vaughan Garfinkle is a 21 y.o. male who presents with depressed and anxious mood\affect.  Patient was pleasant, cooperative, maintained good eye contact.  He engaged well in therapy session and was dressed  Patient reports primary stressors as "potential intimacy\relationship", work and goal setting.  Patient came in today and LCSW discussed homework "for patient which was to engage in support groups through [employer].  Patient reports that he only followed up 1 time and they said that they were looking into it but patient had no further follow-up.  Patient's new goal is to follow-up with them at least 1 time weekly moving forward.  Patient reports that he is having trouble with goalsetting such as figuring out future goals for work and education.  LCSW encourage patient to reach out to [facility] or [facility] to make an appointment with an academic counselor to gain information and insight into programs that are available.  Patient also to continue to apply for jobs outside of his current job at [employer].  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  Suicidal/Homicidal: Nowithout intent/plan  Therapist Response:    Intervention/Plan: LCSW supportive therapy for praise and encouragement.  LCSW educated patient on signs and symptoms of anxiety and depression.Marland Kitchen  LCSW spoke with patient about the importance of taking medications as prescribed.  LCSW utilized motivational interviewing for open-ended questions, reflective listening, and positive affirmations.  LCSW spoke with patient about CBT therapy and cognitive  restructuring.  Plan for patient is to follow-up with [facility] 1 time weekly for the next 4 weeks.  Plan for patient is to follow-up with at least 1 application 1 time weekly moving forward.  Patient to schedule or research programs or [employer] or [employer]   Plan: Return again in 3 weeks.  Diagnosis: Major depressive disorder, recurrent episode, mild (HCC)  GAD (generalized anxiety disorder)  Collaboration of Care: Other None today   Patient/Guardian was advised Release of Information must be obtained prior to any record release in order to collaborate their care with an outside provider. Patient/Guardian was advised if they have not already done so to contact the registration department to sign all necessary forms in order for [place] to release information regarding their care.   Consent: Patient/Guardian gives verbal consent for treatment and assignment of benefits for services provided during this visit. Patient/Guardian expressed understanding and agreed to proceed.   [name], LCSW 11/02/2021

## 2021-11-23 ENCOUNTER — Ambulatory Visit (HOSPITAL_COMMUNITY): Payer: Medicaid Other | Admitting: Licensed Clinical Social Worker

## 2021-11-24 ENCOUNTER — Telehealth (HOSPITAL_COMMUNITY): Payer: Self-pay | Admitting: Physician Assistant

## 2021-12-15 ENCOUNTER — Ambulatory Visit (HOSPITAL_COMMUNITY): Payer: Medicaid Other | Admitting: Licensed Clinical Social Worker

## 2021-12-22 ENCOUNTER — Ambulatory Visit (INDEPENDENT_AMBULATORY_CARE_PROVIDER_SITE_OTHER): Payer: Medicaid Other | Admitting: Licensed Clinical Social Worker

## 2021-12-22 DIAGNOSIS — F411 Generalized anxiety disorder: Secondary | ICD-10-CM

## 2021-12-22 DIAGNOSIS — F330 Major depressive disorder, recurrent, mild: Secondary | ICD-10-CM | POA: Diagnosis not present

## 2021-12-22 NOTE — Progress Notes (Signed)
   THERAPIST PROGRESS NOTE  Session Time: 30   Participation Level: Active  Behavioral Response: CasualAlertAnxious  Type of Therapy: Individual Therapy  Treatment Goals addressed: [name] WILL ATTEND AT LEAST 80% OF SCHEDULED GROUP PSYCHOTHERAPY SESSIONS  ProgressTowards Goals: Progressing  Interventions: CBT and Motivational Interviewing  Summary: Ralph [name] is a 21 y.o. male who presents with depressed and anxious mood\affect.  Patient was pleasant, cooperative, maintained good eye contact.  He engaged well in therapy session was dressed casually.  Patient was alert and oriented x5.  [name] reports primary stressors as family illness.  Patient reports that his father was diagnosed with a type of heart murmur where he may need a pacemaker.  Patient states that his father should decrease the level of activity that he has been doing around the house and patient wants to engage more in chores.     Suicidal/Homicidal: Nowithout intent/plan  Therapist Response:      Intervention/Plan: LCSW spoke with patient about being proactive versus reactive.  LCSW spoke with patient about utilizing assertive language to take on more of a role in the household.  LCSW utilized supportive language for praise and encouragement.  LCSW administered a GAD-7.  LCSW administered the PHQ-9.  LCSW reviewed scores with patient.  LCSW encouraged patient to continue to engage with [facility] for group therapy at least 1 x weekly.  Patient endorses symptoms for flat mood\affect, and lack of motivation  Plan: Return again in 3 weeks.  Diagnosis: Major depressive disorder, recurrent episode, mild (HCC)  GAD (generalized anxiety disorder)  Collaboration of Care: Other None today   Patient/Guardian was advised Release of Information must be obtained prior to any record release in order to collaborate their care with an outside provider. Patient/Guardian was advised if they have not already done so to  contact the registration department to sign all necessary forms in order for [place] to release information regarding their care.   Consent: Patient/Guardian gives verbal consent for treatment and assignment of benefits for services provided during this visit. Patient/Guardian expressed understanding and agreed to proceed.   Dory Horn, LCSW 12/22/2021

## 2021-12-22 NOTE — Plan of Care (Signed)
  Problem: Depression CCP Problem  1 Adjustment disorder  Goal:  walk 3 x weekly  Outcome: Progressing Goal: STG: Jarrius WILL ATTEND AT LEAST 80% OF SCHEDULED GROUP PSYCHOTHERAPY SESSIONS Outcome: Progressing Goal: STG: Arihaan WILL COMPLETE AT LEAST 80% OF ASSIGNED HOMEWORK Outcome: Progressing

## 2022-01-18 ENCOUNTER — Encounter (HOSPITAL_COMMUNITY): Payer: Medicaid Other | Admitting: Student in an Organized Health Care Education/Training Program
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
## 2022-02-08 ENCOUNTER — Ambulatory Visit (INDEPENDENT_AMBULATORY_CARE_PROVIDER_SITE_OTHER): Payer: Medicaid Other | Admitting: Licensed Clinical Social Worker

## 2022-02-08 DIAGNOSIS — F411 Generalized anxiety disorder: Secondary | ICD-10-CM

## 2022-02-08 DIAGNOSIS — F330 Major depressive disorder, recurrent, mild: Secondary | ICD-10-CM | POA: Diagnosis not present

## 2022-02-08 NOTE — Plan of Care (Signed)
  Problem: Depression CCP Problem  1 Adjustment disorder  Goal:  walk 3 x weekly  Outcome: Progressing Goal: STG: Ralph [name] WILL COMPLETE AT LEAST 80% OF ASSIGNED HOMEWORK Outcome: Progressing   Problem: Depression CCP Problem  1 Adjustment disorder  Goal: STG: Ralph [name] WILL ATTEND AT LEAST 80% OF SCHEDULED GROUP PSYCHOTHERAPY SESSIONS Outcome: Not Progressing

## 2022-02-08 NOTE — Progress Notes (Signed)
   THERAPIST PROGRESS NOTE  Session Time: 30   Participation Level: Active  Behavioral Response: CasualAlertAnxious  Type of Therapy: Individual Therapy  Treatment Goals addressed: STG: Ralph [name] WILL COMPLETE AT LEAST 80% OF ASSIGNED HOMEWORK    ProgressTowards Goals: Progressing  Interventions: CBT, Motivational Interviewing, and Supportive  Summary: Ralph [name] is a 21 y.o. male who presents with depressed and anxious mood\affect.  Patient was pleasant, cooperative, maintained good eye contact.  He engaged well in therapy session was dressed casually.  Jehu was alert and oriented x5.  Patient reports primary stressors as financials, work, and family conflict.  Patient reports that his mother's mental health has been suffering and because of it she has been working less.  Patient reports that he feels that financial responsibility to the family to help get them through with bills.  Patient reports that he has been receiving more pressure from stepfather and grandmother to prioritize his mental health.  This is in reference to going to group therapy.  Patient reports that he has been overwhelmed, stressed, and tense.  Suicidal/Homicidal: Nowithout intent/plan  Therapist Response:    Intervention/Plan: LCSW utilized education for taking medications as prescribed.  Patient reports that he has not refilled his medications in about 1 week.  LCSW educated patient on maintaining appointments.  Plan for patient is to follow-up and make medication management appointment.  Patient to refill prescriptions.  Patient to practice assertive communication which was a worksheet provided for patient in last session.  Plan: Return again in 3 weeks.  Diagnosis: Major depressive disorder, recurrent episode, mild (HCC)  GAD (generalized anxiety disorder)  Collaboration of Care: Other None today   Patient/Guardian was advised Release of Information must be obtained prior to any record release in  order to collaborate their care with an outside provider. Patient/Guardian was advised if they have not already done so to contact the registration department to sign all necessary forms in order for [place] to release information regarding their care.   Consent: Patient/Guardian gives verbal consent for treatment and assignment of benefits for services provided during this visit. Patient/Guardian expressed understanding and agreed to proceed.   Dory Horn, LCSW 02/08/2022

## 2022-02-21 ENCOUNTER — Encounter (HOSPITAL_COMMUNITY): Payer: Medicaid Other | Admitting: Student in an Organized Health Care Education/Training Program

## 2022-03-01 ENCOUNTER — Ambulatory Visit (HOSPITAL_COMMUNITY): Payer: Medicaid Other | Admitting: Licensed Clinical Social Worker

## 2022-03-21 ENCOUNTER — Ambulatory Visit (INDEPENDENT_AMBULATORY_CARE_PROVIDER_SITE_OTHER): Payer: Medicaid Other | Admitting: Licensed Clinical Social Worker

## 2022-03-21 DIAGNOSIS — F330 Major depressive disorder, recurrent, mild: Secondary | ICD-10-CM

## 2022-03-21 DIAGNOSIS — F411 Generalized anxiety disorder: Secondary | ICD-10-CM

## 2022-03-21 NOTE — Progress Notes (Signed)
   THERAPIST PROGRESS NOTE  Session Time: 46  Participation Level: Active  Behavioral Response: Casual and Fairly GroomedAlertAnxious and Depressed  Type of Therapy: Individual Therapy  Treatment Goals addressed: STG: Joffre WILL ATTEND AT LEAST 80% OF SCHEDULED GROUP PSYCHOTHERAPY SESSIONS   ProgressTowards Goals: Progressing  Interventions: Motivational Interviewing and Supportive  Summary: Ralph [name] is a 21 y.o. male who presents with depressed and tearful mood\affect.  Patient was pleasant, cooperative, maintained good eye contact.  He engaged well in therapy session was dressed casually.  Patient comes in today with primary stressors as financials and family conflict.  Patient reports that his stepfather was hospitalized recently and income has been tight for the household.  Patient reports that he makes about $400 every 2 weeks and states that the majority of it has been going towards the household.  Patient states that he would like to advocate for himself by presenting a compromise of a percentage check that community supports household goods and services.  Patient reports that when he has these conversations he becomes overwhelmed and sometimes emotional with tearfulness.  Suicidal/Homicidal: Nowithout intent/plan  Therapist Response:     Intervention/Plan: LCSW provided patient "tips for setting healthy boundaries".  This was a worksheet provided through the therapy [email or web address]  LCSW spoke with patient about advocating for himself.  LCSW spoke with patient about communication techniques such as nonverbal's for hand gestures and tone of voice.  LCSW supportive therapy for praise and encouragement in regards to advocating for himself.  LCSW psychoanalytic therapy for patient to express thoughts, feelings and concerns about communication techniques and efficacy in session.  Plan: Return again in 3 weeks.  Diagnosis: Major depressive disorder, recurrent episode, mild  (HCC)  GAD (generalized anxiety disorder)  Collaboration of Care: Other None today   Patient/Guardian was advised Release of Information must be obtained prior to any record release in order to collaborate their care with an outside provider. Patient/Guardian was advised if they have not already done so to contact the registration department to sign all necessary forms in order for [place] to release information regarding their care.   Consent: Patient/Guardian gives verbal consent for treatment and assignment of benefits for services provided during this visit. Patient/Guardian expressed understanding and agreed to proceed.   [name], LCSW 03/21/2022

## 2022-04-20 ENCOUNTER — Ambulatory Visit (HOSPITAL_COMMUNITY): Payer: Medicaid Other | Admitting: Licensed Clinical Social Worker

## 2022-05-11 ENCOUNTER — Ambulatory Visit (HOSPITAL_COMMUNITY): Payer: Medicaid Other | Admitting: Licensed Clinical Social Worker

## 2022-06-01 ENCOUNTER — Ambulatory Visit (INDEPENDENT_AMBULATORY_CARE_PROVIDER_SITE_OTHER): Payer: Medicaid Other | Admitting: Licensed Clinical Social Worker

## 2022-06-01 DIAGNOSIS — F330 Major depressive disorder, recurrent, mild: Secondary | ICD-10-CM

## 2022-06-01 DIAGNOSIS — F411 Generalized anxiety disorder: Secondary | ICD-10-CM

## 2022-06-01 NOTE — Progress Notes (Signed)
THERAPIST PROGRESS NOTE  Session Time: 30  Participation Level: Active  Behavioral Response: CasualAlertAnxious and Depressed  Type of Therapy: Individual Therapy  Treatment Goals addressed:  Active     Depression CCP Problem  1 Adjustment disorder       walk 3 x weekly  (Progressing)     Start:  06/29/21    Expected End:  08/02/22       Goal Note     2 x per week self reported          Decrease GAD-7 below 5  (Completed/Met)     Start:  06/29/21    Expected End:  12/31/21    Resolved:  09/14/21      LTG: Ralph [name] WILL SCORE LESS THAN 10 ON THE PATIENT HEALTH QUESTIONNAIRE (PHQ-9) (Completed/Met)     Start:  06/29/21    Expected End:  12/31/21    Resolved:  09/14/21      STG: Ralph [name] WILL ATTEND AT LEAST 80% OF SCHEDULED GROUP PSYCHOTHERAPY SESSIONS (Progressing)     Start:  06/29/21    Expected End:  08/02/22         STG: Ralph [name] WILL COMPLETE AT LEAST 80% OF ASSIGNED HOMEWORK (Progressing)     Start:  06/29/21    Expected End:  08/02/22           OP Depression     LTG: Save 20 percent of pay checks monthly (Progressing)     Start:  03/21/22    Expected End:  09/01/22       Goal Note     Saving about 8-10 percent of checks currently. Or $35 out of $400 weekly             ProgressTowards Goals: Progressing  Interventions: Motivational Interviewing and Supportive  Summary: Ralph [name] is a 22 y.o. male who presents with flat and depressed mood\affect.  Patient was pleasant, cooperative, maintained good eye contact.  He engaged well in therapy session was dressed casually.  Patient comes in today with stressor for work.  He reports that he overall enjoys his job but states that corporate has implemented new rules that have affected his transportation to and from work.  Ralph [name] reports that some of this is his own doing as he does not want to get his license.  Ralph [name] reports that he has been able to get through this and manage things with his  stepfather and mother.   Other stressors are poor family conflict in regards to his mother's employment status.  Ralph [name] states that she has gotten a job at [employer] but it is much less than her previous job where patient works currently.  Ralph [name] states that his mother quit her job suddenly without any notice and is unable to return to work at this time although Ralph [name] has advocated to his managers to let her return despite not giving a 2 weeks notice.  Suicidal/Homicidal: Nowithout intent/plan  Therapist Response:    Intervention\plan: LCSW psychoanalytic therapy for patient to express thoughts, feelings and emotions.  LCSW's person centered therapy for empowerment.  LCSW used CBT therapy for reframing.  LCSW used motivational interviewing for patient using positive affirmations, reflective listening, and questions.   Plan: Return again in 3 weeks.  Diagnosis: No diagnosis found.  Collaboration of Care: Other None today   Patient/Guardian was advised Release of Information must be obtained prior to any record release in order to collaborate their care with an outside provider. Patient/Guardian was advised  if they have not already done so to contact the registration department to sign all necessary forms in order for [place] to release information regarding their care.   Consent: Patient/Guardian gives verbal consent for treatment and assignment of benefits for services provided during this visit. Patient/Guardian expressed understanding and agreed to proceed.   Dory Horn, LCSW 06/01/2022

## 2022-06-29 ENCOUNTER — Ambulatory Visit (HOSPITAL_COMMUNITY): Payer: Medicaid Other | Admitting: Licensed Clinical Social Worker

## 2022-08-02 ENCOUNTER — Ambulatory Visit (HOSPITAL_COMMUNITY): Payer: Medicaid Other | Admitting: Licensed Clinical Social Worker

## 2022-09-06 ENCOUNTER — Ambulatory Visit (HOSPITAL_COMMUNITY): Payer: Medicaid Other | Admitting: Licensed Clinical Social Worker

## 2022-10-12 ENCOUNTER — Ambulatory Visit (INDEPENDENT_AMBULATORY_CARE_PROVIDER_SITE_OTHER): Payer: Medicaid Other | Admitting: Licensed Clinical Social Worker

## 2022-10-12 DIAGNOSIS — F411 Generalized anxiety disorder: Secondary | ICD-10-CM
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
## 2022-11-08 ENCOUNTER — Ambulatory Visit (INDEPENDENT_AMBULATORY_CARE_PROVIDER_SITE_OTHER): Payer: Medicaid Other | Admitting: Licensed Clinical Social Worker

## 2022-11-08 DIAGNOSIS — F330 Major depressive disorder, recurrent, mild: Secondary | ICD-10-CM | POA: Diagnosis not present

## 2022-11-08 NOTE — Progress Notes (Signed)
THERAPIST PROGRESS NOTE  Virtual Visit via Video Note  I connected with Ralph [name] on 11/08/22 at  2:00 PM EDT by a video enabled telemedicine application and verified that I am speaking with the correct person using two identifiers.  Location: Patient: Ralph [name]  Provider: Providers Home    I discussed the limitations of evaluation and management by telemedicine and the availability of in person appointments. The patient expressed understanding and agreed to proceed.    I discussed the assessment and treatment plan with the patient. The patient was provided an opportunity to ask questions and all were answered. The patient agreed with the plan and demonstrated an understanding of the instructions.   The patient was advised to call back or seek an in-person evaluation if the symptoms worsen or if the condition fails to improve as anticipated.  I provided 30  minutes of non-face-to-face time during this encounter.  Ralph Cooks, LCSW   Participation Level: Active  Behavioral Response: CasualAlertAnxious  Type of Therapy: Individual Therapy  Treatment Goals addressed:  Active     Depression CCP Problem  1 Adjustment disorder       walk 3 x weekly  (Completed/Met)     Start:  06/29/21    Expected End:  12/30/22    Resolved:  10/12/22    Goal Note     Pt self reports working and walking 3 to 4 days per week.          Decrease GAD-7 below 5  (Completed/Met)     Start:  06/29/21    Expected End:  12/31/21    Resolved:  09/14/21      LTG: Ralph [name] WILL SCORE LESS THAN 10 ON THE PATIENT HEALTH QUESTIONNAIRE (PHQ-9) (Completed/Met)     Start:  06/29/21    Expected End:  12/31/21    Resolved:  09/14/21      STG: Ralph [name] WILL ATTEND AT LEAST 80% OF SCHEDULED GROUP PSYCHOTHERAPY SESSIONS (Progressing)     Start:  06/29/21    Expected End:  12/30/22         STG: Ralph [name] WILL COMPLETE AT LEAST 80% OF ASSIGNED HOMEWORK (Progressing)     Start:  06/29/21     Expected End:  12/30/22         WORK WITH Ralph [name] TO TRACK SYMPTOMS, TRIGGERS AND/OR SKILL USE THROUGH A MOOD CHART, DIARY CARD, OR JOURNAL (Completed)     Start:  06/29/21    End:  08/16/21      ENCOURAGE Ralph [name] TO PARTICIPATE IN RECOVERY PEER SUPPORT ACTIVITIES WEEKLY (Completed)     Start:  06/29/21    End:  08/16/21      Administer the PHQ-9 or MADRS weekly for 4 weeks (Completed)     Start:  06/29/21    End:  09/14/21      PROVIDE Ralph [name] WITH EDUCATIONAL INFORMATION AND READING MATERIAL ON DISSOCIATION, ITS CAUSES, AND SYMPTOMS (Completed)     Start:  06/29/21    End:  08/16/21      WORK WITH Ralph [name] TO IDENTIFY THE MAJOR COMPONENTS OF A RECENT EPISODE OF DEPRESSION: PHYSICAL SYMPTOMS, MAJOR THOUGHTS AND IMAGES, AND MAJOR BEHAVIORS THEY EXPERIENCED (Completed)     Start:  06/29/21    End:  09/14/21        OP Depression     LTG: Save 20 percent of pay checks monthly (Progressing)     Start:  03/21/22    Expected End:  12/30/22  ProgressTowards Goals: Progressing  Interventions: Motivational Interviewing, Supportive, and Reframing  Summary: Ralph [name] is a 22 y.o. male who presents with   Flat mood\affect.  Patient was pleasant, cooperative, maintained good eye contact.  He engaged well in therapy session was dressed casually.  Patient reports primary stressors as work and symptoms for ADHD.  Ralph [name] reports that he has no official diagnosis of ADHD but states that many people of told him that he has ADHD.  Patient reports forgetfulness, lack of focus, and poor follow-through on tasks.  LCSW provided patient with resources for ADHD testing for [place] [facility].  Ralph [name] reported today that his mother would like to come in to his next in person session.  LCSW spoke with patient about the right to self determination and if he was okay with that LCSW was okay for mother to join in on a session. Suicidal/Homicidal: Nowithout  intent/plan  Therapist Response:     Intervention/Plan: LCSW psychoanalytic therapy for patient to express thoughts, feelings and emotions.  LCSW supportive therapy for praise and encouragement.  LCSW used motivational interviewing for open-ended questions, positive affirmations, and reflective listening.  LCSW solution focused therapy providing patient resources to ADHD testing in the [place] area.  Plan: Return again in 4 weeks.  Diagnosis: Major depressive disorder, recurrent episode, mild (HCC)  Collaboration of Care: Other None today   Patient/Guardian was advised Release of Information must be obtained prior to any record release in order to collaborate their care with an outside provider. Patient/Guardian was advised if they have not already done so to contact the registration department to sign all necessary forms in order for [place] to release information regarding their care.   Consent: Patient/Guardian gives verbal consent for treatment and assignment of benefits for services provided during this visit. Patient/Guardian expressed understanding and agreed to proceed.   Ralph Cooks, LCSW 11/08/2022

## 2022-12-07 ENCOUNTER — Ambulatory Visit (INDEPENDENT_AMBULATORY_CARE_PROVIDER_SITE_OTHER): Payer: Medicaid Other | Admitting: Licensed Clinical Social Worker

## 2022-12-07 DIAGNOSIS — F330 Major depressive disorder, recurrent, mild: Secondary | ICD-10-CM | POA: Diagnosis not present
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
## 2023-01-04 ENCOUNTER — Ambulatory Visit (HOSPITAL_COMMUNITY): Payer: Medicaid Other | Admitting: Licensed Clinical Social Worker

## 2023-02-07 ENCOUNTER — Ambulatory Visit (HOSPITAL_COMMUNITY): Payer: Medicaid Other | Admitting: Licensed Clinical Social Worker

## 2023-02-09 ENCOUNTER — Ambulatory Visit (HOSPITAL_COMMUNITY): Payer: Medicaid Other | Admitting: Licensed Clinical Social Worker

## 2023-02-09 DIAGNOSIS — F411 Generalized anxiety disorder: Secondary | ICD-10-CM | POA: Diagnosis not present

## 2023-02-09 DIAGNOSIS — F330 Major depressive disorder, recurrent, mild: Secondary | ICD-10-CM | POA: Diagnosis not present

## 2023-02-09 NOTE — Progress Notes (Signed)
THERAPIST PROGRESS NOTE  Virtual Visit via Video Note  I connected with Ralph [name] on 02/09/23 at  3:00 PM EST by a video enabled telemedicine application and verified that I am speaking with the correct person using two identifiers.  Location: Patient: [facility]  Provider: [facility]iders Home    I discussed the limitations of evaluation and management by telemedicine and the availability of in person appointments. The patient expressed understanding and agreed to proceed.  I discussed the assessment and treatment plan with the patient. The patient was provided an opportunity to ask questions and all were answered. The patient agreed with the plan and demonstrated an understanding of the instructions.   The patient was advised to call back or seek an in-person evaluation if the symptoms worsen or if the condition fails to improve as anticipated.  I provided 30 minutes of non-face-to-face time during this encounter.   Ralph Cooks, LCSW   Participation Level: Active  Behavioral Response: CasualAlertAnxious and Depressed  Type of Therapy: Individual Therapy  Treatment Goals addressed:  Active     Depression CCP Problem  1 Adjustment disorder       walk 3 x weekly  (Completed/Met)     Start:  06/29/21    Expected End:  12/30/22    Resolved:  10/12/22    Goal Note     Pt self reports working and walking 3 to 4 days per week.          Decrease GAD-7 below 5  (Completed/Met)     Start:  06/29/21    Expected End:  12/31/21    Resolved:  09/14/21      LTG: Ralph [name] WILL SCORE LESS THAN 10 ON THE PATIENT HEALTH QUESTIONNAIRE (PHQ-9) (Completed/Met)     Start:  06/29/21    Expected End:  12/31/21    Resolved:  09/14/21      STG: Ralph [name] WILL ATTEND AT LEAST 80% OF SCHEDULED GROUP PSYCHOTHERAPY SESSIONS (Progressing)     Start:  06/29/21    Expected End:  08/04/23         STG: Ralph [name] WILL COMPLETE AT LEAST 80% OF ASSIGNED HOMEWORK (Progressing)      Start:  06/29/21    Expected End:  08/04/23         WORK WITH Ralph [name] TO TRACK SYMPTOMS, TRIGGERS AND/OR SKILL USE THROUGH A MOOD CHART, DIARY CARD, OR JOURNAL (Completed)     Start:  06/29/21    End:  08/16/21      ENCOURAGE [name] TO PARTICIPATE IN RECOVERY PEER SUPPORT ACTIVITIES WEEKLY (Completed)     Start:  06/29/21    End:  08/16/21      Administer the PHQ-9 or MADRS weekly for 4 weeks (Completed)     Start:  06/29/21    End:  09/14/21      PROVIDE Ralph [name] WITH EDUCATIONAL INFORMATION AND READING MATERIAL ON DISSOCIATION, ITS CAUSES, AND SYMPTOMS (Completed)     Start:  06/29/21    End:  08/16/21      WORK WITH Ralph [name] TO IDENTIFY THE MAJOR COMPONENTS OF A RECENT EPISODE OF DEPRESSION: PHYSICAL SYMPTOMS, MAJOR THOUGHTS AND IMAGES, AND MAJOR BEHAVIORS THEY EXPERIENCED (Completed)     Start:  06/29/21    End:  09/14/21        OP Depression     LTG: Save 20 percent of pay checks monthly (Progressing)     Start:  03/21/22    Expected End:  08/04/23  ProgressTowards Goals: Progressing  Interventions: CBT and Motivational Interviewing   Suicidal/Homicidal: Nowithout intent/plan  Therapist Response:  Patient was alert and oriented times 5.  Ralph [name] was pleasant, cooperative, maintained good eye contact.  He engaged well in therapy session was dressed casually.  He presented today with tearful and anxious mood\affect.  Patient comes in today with primary stressors as election results.  He reports that he is overwhelmed with anxiety and fear for his sister and mother.  He reports that as long as he stays busy his anxiety is decreased.  It is when he starts to think about the things that pending President Trump will put into policy.  [name] endorses feelings for tension, worry, and restlessness.  Intervention\plan: LCSW supportive therapy for praise and encouragement.  LCSW used unconditional positive regard utilizing nonjudgmental stance.  LCSW used psycho  analytic therapy for patient to express thoughts, feelings and emotions in session.  LCSW spoke with patient on the positives that are going on in his life such as health and work.  LCSW spoke with patient about the benefits of focusing on what she can control versus of things that you cannot control.    Plan: Return again in 8 weeks.  Diagnosis: Major depressive disorder, recurrent episode, mild (HCC)  GAD (generalized anxiety disorder)  Collaboration of Care: Other None today   Patient/Guardian was advised Release of Information must be obtained prior to any record release in order to collaborate their care with an outside provider. Patient/Guardian was advised if they have not already done so to contact the registration department to sign all necessary forms in order for [place] to release information regarding their care.   Consent: Patient/Guardian gives verbal consent for treatment and assignment of benefits for services provided during this visit. Patient/Guardian expressed understanding and agreed to proceed.   Ralph Cooks, LCSW 02/09/2023

## 2023-04-12 ENCOUNTER — Ambulatory Visit (HOSPITAL_COMMUNITY): Payer: Medicaid Other | Admitting: Licensed Clinical Social Worker

## 2023-04-12 DIAGNOSIS — F411 Generalized anxiety disorder: Secondary | ICD-10-CM

## 2023-04-12 DIAGNOSIS — F330 Major depressive disorder, recurrent, mild: Secondary | ICD-10-CM | POA: Diagnosis not present

## 2023-04-12 NOTE — Progress Notes (Signed)
 THERAPIST PROGRESS NOTE  Virtual Visit via Video Note  I connected with Ralph [name] on 04/12/23 at  9:00 AM EST by a video enabled telemedicine application and verified that I am speaking with the correct person using two identifiers.  Location: Patient: [facility]  Provider: [facility]    I discussed the limitations of evaluation and management by telemedicine and the availability of in person appointments. The patient expressed understanding and agreed to proceed.     I discussed the assessment and treatment plan with the patient. The patient was provided an opportunity to ask questions and all were answered. The patient agreed with the plan and demonstrated an understanding of the instructions.   The patient was advised to call back or seek an in-person evaluation if the symptoms worsen or if the condition fails to improve as anticipated.  I provided 30 minutes of non-face-to-face time during this encounter.   Ralph GORMAN Patee, LCSW   Participation Level: Active  Behavioral Response: CasualAlertAnxious and Depressed  Type of Therapy: Individual Therapy  Treatment Goals addressed:  Active     Depression CCP Problem  1 Adjustment disorder       walk 3 x weekly  (Completed/Met)     Start:  06/29/21    Expected End:  12/30/22    Resolved:  10/12/22    Goal Note     Pt self reports working and walking 3 to 4 days per week.          Decrease GAD-7 below 5  (Completed/Met)     Start:  06/29/21    Expected End:  12/31/21    Resolved:  09/14/21      LTG: Ralph WILL SCORE LESS THAN 10 ON THE PATIENT HEALTH QUESTIONNAIRE (PHQ-9) (Completed/Met)     Start:  06/29/21    Expected End:  12/31/21    Resolved:  09/14/21      STG: Ralph WILL ATTEND AT LEAST 80% OF SCHEDULED GROUP PSYCHOTHERAPY SESSIONS (Progressing)     Start:  06/29/21    Expected End:  08/04/23         STG: Ralph WILL COMPLETE AT LEAST 80% OF ASSIGNED HOMEWORK (Progressing)      Start:  06/29/21    Expected End:  08/04/23         WORK WITH Ralph TO TRACK SYMPTOMS, TRIGGERS AND/OR SKILL USE THROUGH A MOOD CHART, DIARY CARD, OR JOURNAL (Completed)     Start:  06/29/21    End:  08/16/21      ENCOURAGE Ralph [name] TO PARTICIPATE IN RECOVERY PEER SUPPORT ACTIVITIES WEEKLY (Completed)     Start:  06/29/21    End:  08/16/21      Administer the PHQ-9 or MADRS weekly for 4 weeks (Completed)     Start:  06/29/21    End:  09/14/21      PROVIDE Ralph WITH EDUCATIONAL INFORMATION AND READING MATERIAL ON DISSOCIATION, ITS CAUSES, AND SYMPTOMS (Completed)     Start:  06/29/21    End:  08/16/21      WORK WITH Ralph TO IDENTIFY THE MAJOR COMPONENTS OF A RECENT EPISODE OF DEPRESSION: PHYSICAL SYMPTOMS, MAJOR THOUGHTS AND IMAGES, AND MAJOR BEHAVIORS THEY EXPERIENCED (Completed)     Start:  06/29/21    End:  09/14/21        OP Depression     LTG: Save 20 percent of pay checks monthly (Progressing)     Start:  03/21/22    Expected End:  08/04/23            ProgressTowards Goals: Progressing  Interventions: CBT and Motivational Interviewing  Summary: Ralph [name] is a 23 y.o. male who presents with anxious mood\affect.  Patient was pleasant, cooperative, maintained good eye contact.  He engaged well in therapy session was dressed casually.  Ralph [name] reports that everything has been going well.  He reports that overall his anxiety and depression has been well-managed.  He reports taking his medications as prescribed.  He reports some feelings of tension and worry with the state that the country is currently heading in with a new president set to take office in the next few weeks.  He reports that he has been staying off of social media.  Ralph [name] reports that he is focusing on the things he can control such as work and home life.  Ralph [name] reports that he would like to be seen in person 1 of these times with his mother in session.  Suicidal/Homicidal: Nowithout  intent/plan  Therapist Response:      Interventions/Plan: LCSW supportive therapy for praise and encouragement.  LCSW used person centered therapy for empowerment.  LCSW psycho analytic therapy for patient to express thoughts, feelings and concerns and nonjudgmental environment.  LCSW utilized motivational interviewing for open-ended questions and reflective listening.  Plan: Return again in 5 weeks.  Diagnosis: Major depressive disorder, recurrent episode, mild (HCC)  GAD (generalized anxiety disorder)  Collaboration of Care: Other None today   Patient/Guardian was advised Release of Information must be obtained prior to any record release in order to collaborate their care with an outside provider. Patient/Guardian was advised if they have not already done so to contact the registration department to sign all necessary forms in order for us  to release information regarding their care.   Consent: Patient/Guardian gives verbal consent for treatment and assignment of benefits for services provided during this visit. Patient/Guardian expressed understanding and agreed to proceed.   Ralph GORMAN Patee, LCSW 04/12/2023

## 2023-05-16 ENCOUNTER — Ambulatory Visit (INDEPENDENT_AMBULATORY_CARE_PROVIDER_SITE_OTHER): Payer: Medicaid Other | Admitting: Licensed Clinical Social Worker

## 2023-05-16 DIAGNOSIS — F330 Major depressive disorder, recurrent, mild: Secondary | ICD-10-CM

## 2023-05-16 DIAGNOSIS — F411 Generalized anxiety disorder: Secondary | ICD-10-CM

## 2023-05-16 NOTE — Progress Notes (Addendum)
THERAPIST PROGRESS NOTE  Virtual Visit via Video Note  I connected with Ralph [name] on 05/16/23 at  3:00 PM EST by a video enabled telemedicine application and verified that I am speaking with the correct person using two identifiers.  Location: Patient: [facility]  Provider: [facility]    I discussed the limitations of evaluation and management by telemedicine and the availability of in person appointments. The patient expressed understanding and agreed to proceed.    I discussed the assessment and treatment plan with the patient. The patient was provided an opportunity to ask questions and all were answered. The patient agreed with the plan and demonstrated an understanding of the instructions.   The patient was advised to call back or seek an in-person evaluation if the symptoms worsen or if the condition fails to improve as anticipated.  I provided 30 minutes of non-face-to-face time during this encounter.   Ralph Cooks, LCSW   Participation Level: Active  Behavioral Response: CasualAlertAnxious and Depressed  Type of Therapy: Individual Therapy  Treatment Goals addressed:  Active     Depression CCP Problem  1 Adjustment disorder       walk 3 x weekly  (Completed/Met)     Start:  06/29/21    Expected End:  12/30/22    Resolved:  10/12/22    Goal Note     Pt self reports working and walking 3 to 4 days per week.          Decrease GAD-7 below 5  (Completed/Met)     Start:  06/29/21    Expected End:  12/31/21    Resolved:  09/14/21      LTG: Ralph [name] WILL SCORE LESS THAN 10 ON THE PATIENT HEALTH QUESTIONNAIRE (PHQ-9) (Completed/Met)     Start:  06/29/21    Expected End:  12/31/21    Resolved:  09/14/21      STG: Ralph [name] WILL ATTEND AT LEAST 80% OF SCHEDULED GROUP PSYCHOTHERAPY SESSIONS (Not Progressing)     Start:  06/29/21    Expected End:  08/04/23         STG: Ralph [name] WILL COMPLETE AT LEAST 80% OF ASSIGNED HOMEWORK (Progressing)      Start:  06/29/21    Expected End:  08/04/23         WORK WITH Ralph [name] TO TRACK SYMPTOMS, TRIGGERS AND/OR SKILL USE THROUGH A MOOD CHART, DIARY CARD, OR JOURNAL (Completed)     Start:  06/29/21    End:  08/16/21      ENCOURAGE Ralph [name] TO PARTICIPATE IN RECOVERY PEER SUPPORT ACTIVITIES WEEKLY (Completed)     Start:  06/29/21    End:  08/16/21      Administer the PHQ-9 or MADRS weekly for 4 weeks (Completed)     Start:  06/29/21    End:  09/14/21      PROVIDE Ralph [name] WITH EDUCATIONAL INFORMATION AND READING MATERIAL ON DISSOCIATION, ITS CAUSES, AND SYMPTOMS (Completed)     Start:  06/29/21    End:  08/16/21      WORK WITH Ralph [name] TO IDENTIFY THE MAJOR COMPONENTS OF A RECENT EPISODE OF DEPRESSION: PHYSICAL SYMPTOMS, MAJOR THOUGHTS AND IMAGES, AND MAJOR BEHAVIORS THEY EXPERIENCED (Completed)     Start:  06/29/21    End:  09/14/21        OP Depression     LTG: Save 20 percent of pay checks monthly (Progressing)     Start:  03/21/22    Expected End:  08/04/23  ProgressTowards Goals: Progressing  Interventions: CBT, Motivational Interviewing, and Supportive  Summary: Ralph [name] is a 23 y.o. male who presents with depressed and anxious mood\affect.  Patient was pleasant, cooperative, maintained good eye contact.  He engaged well in therapy session was dressed casually.  Ralph [name] presented today with his mother.  Patient reports primary stressors as work.  Patient reports that he works at [employer] but due to the bird flu, eggs and some other general products have not been fully stocked.  Patient reports that this increases stressors at work.  Ralph [name] then asked LCSW if his mom could come in to session.  LCSW stated that it was his session and that he could allow mom to talk if that is what he wanted.  Ralph [name] then handed the phone over to his mother.  Mother states that she has concerns about Ralph [name]'s overall confidence, responsibility, and capability to  live independently.  Mom reports that this is not a capacity issue as much as it is a want issue.  She reports that she truly believes that Ralph [name] can live independently if he tries but sometimes is too dependent off of mom, dad, grandma.  Mom reports that she does give him responsibilities such as contributing to household bills but Ralph [name] does not see the bigger picture that without 3-4 and comes this would not be feasible for him to live independently off of his current salary.  Suicidal/Homicidal: Nowithout intent/plan  Therapist Response:     Intervention/Plan: LCSW utilized psycho analytic therapy for patient to express thoughts, feelings and emotions in session.  LCSW utilized supportive therapy for praise and encouragement.  LCSW utilized person centered therapy for unconditional positive regard utilizing nonjudgmental stance.  LCSW used strength-based therapy to review patient's goals of such as savings, independent living, and building confidence.  LCSW educated patient on behavioral modifications such as negative and positive reinforcement.  LCSW utilized motivational interviewing for open-ended questions, reflective listening, and positive affirmations.  Plan: Return again in 5 weeks.  Diagnosis: Major depressive disorder, recurrent episode, mild (HCC)  GAD (generalized anxiety disorder)  Collaboration of Care: Other None today     Patient/Guardian was advised Release of Information must be obtained prior to any record release in order to collaborate their care with an outside provider. Patient/Guardian was advised if they have not already done so to contact the registration department to sign all necessary forms in order for [place] to release information regarding their care.   Consent: Patient/Guardian gives verbal consent for treatment and assignment of benefits for services provided during this visit. Patient/Guardian expressed understanding and agreed to proceed.   Ralph Cooks, LCSW 05/16/2023

## 2023-06-27 ENCOUNTER — Ambulatory Visit (HOSPITAL_COMMUNITY): Payer: Medicaid Other | Admitting: Licensed Clinical Social Worker

## 2023-08-01 ENCOUNTER — Ambulatory Visit (HOSPITAL_COMMUNITY): Admitting: Licensed Clinical Social Worker

## 2023-09-05 ENCOUNTER — Ambulatory Visit (INDEPENDENT_AMBULATORY_CARE_PROVIDER_SITE_OTHER): Admitting: Licensed Clinical Social Worker

## 2023-09-05 DIAGNOSIS — F411 Generalized anxiety disorder: Secondary | ICD-10-CM

## 2023-09-05 DIAGNOSIS — F330 Major depressive disorder, recurrent, mild: Secondary | ICD-10-CM | POA: Diagnosis not present

## 2023-09-05 NOTE — Progress Notes (Signed)
   THERAPIST PROGRESS NOTE  Virtual Visit via Video Note  I connected with Ralph [name] on 09/05/23 at 11:00 AM EDT by a video enabled telemedicine application and verified that I am speaking with the correct person using two identifiers.  Location: Patient: [facility]  Provider: [facility]    I discussed the limitations of evaluation and management by telemedicine and the availability of in person appointments. The patient expressed understanding and agreed to proceed.    I discussed the assessment and treatment plan with the patient. The patient was provided an opportunity to ask questions and all were answered. The patient agreed with the plan and demonstrated an understanding of the instructions.   The patient was advised to call back or seek an in-person evaluation if the symptoms worsen or if the condition fails to improve as anticipated.  I provided 45 minutes of non-face-to-face time during this encounter.   Ralph Small, LCSW   Participation Level: Active  Behavioral Response: CasualAlertAnxious, Depressed, and tearful   Type of Therapy: Individual Therapy  Treatment Goals addressed: .bhop  ProgressTowards Goals: Progressing  Interventions: CBT, Motivational Interviewing, and Supportive    Suicidal/Homicidal: Nowithout intent/plan  Therapist Response:    Ralph [name] was alert and oriented x 5.  He was pleasant, cooperative, maintained good eye contact.  He engaged well in therapy session and was dressed casually.  Patient presented with tearful, depressed, anxious mood\affect.  Patient comes in today after switching from in person to virtual due to not having transportation.  He reports that he has been stressed due to the current political climate, self-neglect, and illness of his dog.  Patient states that he has been upset lately as he has noticed that his dog has declined an overall health.  The dog is currently not eating a lot and has lost a lot  of weight to the point where the family is considering euthanasia.   Patient reports stressors for self-neglect.  He reports that his grandmother and mother want him to start prioritizing himself.  Mother and grandmother would like Ralph [name] to be in [place] and start to prioritizing his mental health so that he can be independent with housing and financials.  Currently patient has been living with his parents and working part-time at [employer].  Patient reports that he does things for other people so he can distract from his flaws.   Intervention/plan: LCSW utilized psychoanalytic therapy for patient to express thoughts, feelings and concerns and nonjudgmental environment.  LCSW utilized cognitive behavioral therapy for reframing and cognitive restructuring.  LCSW educated patient on the stages of change for precontemplation, contemplation, action, and maintenance.  Plan: Return again in 4 weeks.  Diagnosis: Major depressive disorder, recurrent episode, mild (HCC)  GAD (generalized anxiety disorder)  Collaboration of Care: Other None today   Patient/Guardian was advised Release of Information must be obtained prior to any record release in order to collaborate their care with an outside provider. Patient/Guardian was advised if they have not already done so to contact the registration department to sign all necessary forms in order for us  to release information regarding their care.   Consent: Patient/Guardian gives verbal consent for treatment and assignment of benefits for services provided during this visit. Patient/Guardian expressed understanding and agreed to proceed.   Ralph Small, LCSW 09/05/2023

## 2023-10-03 ENCOUNTER — Encounter (HOSPITAL_COMMUNITY): Payer: Self-pay

## 2023-10-04 ENCOUNTER — Ambulatory Visit (HOSPITAL_COMMUNITY): Admitting: Licensed Clinical Social Worker

## 2023-10-13 ENCOUNTER — Ambulatory Visit (INDEPENDENT_AMBULATORY_CARE_PROVIDER_SITE_OTHER): Admitting: Licensed Clinical Social Worker

## 2023-10-13 DIAGNOSIS — F330 Major depressive disorder, recurrent, mild: Secondary | ICD-10-CM

## 2023-10-13 NOTE — Progress Notes (Signed)
 THERAPIST PROGRESS NOTE  Virtual Visit via Video Note  I connected with Penne Lewis on 10/13/23 at 10:00 AM EDT by a video enabled telemedicine application and verified that I am speaking with the correct person using two identifiers.  Location: Patient: [facility]  Provider: [facility]    I discussed the limitations of evaluation and management by telemedicine and the availability of in person appointments. The patient expressed understanding and agreed to proceed.     I discussed the assessment and treatment plan with the patient. The patient was provided an opportunity to ask questions and all were answered. The patient agreed with the plan and demonstrated an understanding of the instructions.   The patient was advised to call back or seek an in-person evaluation if the symptoms worsen or if the condition fails to improve as anticipated.  I provided 45 minutes of non-face-to-face time during this encounter.   Juliene GORMAN Patee, LCSW   Participation Level: Active  Behavioral Response: CasualAlertAnxious and Depressed  Type of Therapy: Individual Therapy  Treatment Goals addressed:  Active     Depression CCP Problem  1 Adjustment disorder       walk 3 x weekly  (Completed/Met)     Start:  06/29/21    Expected End:  12/30/22    Resolved:  10/12/22    Goal Note     Pt self reports working and walking 3 to 4 days per week.          Decrease GAD-7 below 5  (Completed/Met)     Start:  06/29/21    Expected End:  12/31/21    Resolved:  09/14/21      LTG: Penne WILL SCORE LESS THAN 10 ON THE PATIENT HEALTH QUESTIONNAIRE (PHQ-9) (Completed/Met)     Start:  06/29/21    Expected End:  12/31/21    Resolved:  09/14/21      STG: Penne WILL ATTEND AT LEAST 80% OF SCHEDULED GROUP PSYCHOTHERAPY SESSIONS (Progressing)     Start:  06/29/21    Expected End:  12/01/23         STG: Penne WILL COMPLETE AT LEAST 80% OF ASSIGNED HOMEWORK (Progressing)      Start:  06/29/21    Expected End:  12/01/23         WORK WITH Penne TO TRACK SYMPTOMS, TRIGGERS AND/OR SKILL USE THROUGH A MOOD CHART, DIARY CARD, OR JOURNAL (Completed)     Start:  06/29/21    End:  08/16/21      ENCOURAGE Lawsen TO PARTICIPATE IN RECOVERY PEER SUPPORT ACTIVITIES WEEKLY (Completed)     Start:  06/29/21    End:  08/16/21      Administer the PHQ-9 or MADRS weekly for 4 weeks (Completed)     Start:  06/29/21    End:  09/14/21      PROVIDE Penne WITH EDUCATIONAL INFORMATION AND READING MATERIAL ON DISSOCIATION, ITS CAUSES, AND SYMPTOMS (Completed)     Start:  06/29/21    End:  08/16/21      WORK WITH Penne TO IDENTIFY THE MAJOR COMPONENTS OF A RECENT EPISODE OF DEPRESSION: PHYSICAL SYMPTOMS, MAJOR THOUGHTS AND IMAGES, AND MAJOR BEHAVIORS THEY EXPERIENCED (Completed)     Start:  06/29/21    End:  09/14/21        OP Depression     LTG: Save 20 percent of pay checks monthly (Progressing)     Start:  03/21/22    Expected End:  12/01/23  ProgressTowards Goals: Progressing  Interventions: CBT, Motivational Interviewing, and Supportive Suicidal/Homicidal: Nowithout intent/plan  Therapist Response:    Burr was alert and oriented x 5.  He was pleasant, cooperative, maintained good eye contact.  Patient engaged well in therapy session was dressed casually.  He presented with depressed mood\affect.  Patient comes in today with primary stressors grief and loss of his dog.  He reports in the last 4 weeks that his dog had to be put down due to old age.  There was no medical diagnosis attached to the dog's declining health.  Patient reports that he is coping with the grief and loss but utilization of support from friends and family.  Patient has been trying to embraces feelings instead of hide them.   Other stressors for patient include financial burdens such as fixing a A/C unit in the upstairs part of the house.  He reports that his family is  advocating through the property management team to get it fixed but they are waiting for a part.  He reports that this has affected his sleep due to the heat on the upper level of the unit.  Intervention/plan: LCSW utilized psychoanalytic therapy for patient to express thoughts, feelings and concerns and nonjudgmental environment.  LCSW utilized education on grief and loss such as the 5 stages of grief.  LCSW educated patient on the use of supportive systems such as family and friends to help process through grief and loss.  LCSW utilized motivational interviewing for reflective listening and open-ended questions.    Plan: Return again in 4 weeks.  Diagnosis: Major depressive disorder, recurrent episode, mild (HCC)  Collaboration of Care: Other None today   Patient/Guardian was advised Release of Information must be obtained prior to any record release in order to collaborate their care with an outside provider. Patient/Guardian was advised if they have not already done so to contact the registration department to sign all necessary forms in order for us  to release information regarding their care.   Consent: Patient/Guardian gives verbal consent for treatment and assignment of benefits for services provided during this visit. Patient/Guardian expressed understanding and agreed to proceed.   Juliene GORMAN Patee, LCSW 10/13/2023

## 2023-11-10 ENCOUNTER — Ambulatory Visit (HOSPITAL_COMMUNITY): Admitting: Licensed Clinical Social Worker

## 2023-11-10 DIAGNOSIS — F330 Major depressive disorder, recurrent, mild: Secondary | ICD-10-CM

## 2023-11-10 DIAGNOSIS — F4323 Adjustment disorder with mixed anxiety and depressed mood: Secondary | ICD-10-CM

## 2023-11-10 DIAGNOSIS — F411 Generalized anxiety disorder: Secondary | ICD-10-CM

## 2023-11-10 NOTE — Progress Notes (Signed)
 THERAPIST PROGRESS NOTE  Virtual Visit via Video Note  I connected with Ralph [name] on 11/10/23 at 10:00 AM EDT by a video enabled telemedicine application and verified that I am speaking with the correct person using two identifiers.  Location: Patient: Ralph [name]  Provider: Providers home Office    I discussed the limitations of evaluation and management by telemedicine and the availability of in person appointments. The patient expressed understanding and agreed to proceed.   I discussed the assessment and treatment plan with the patient. The patient was provided an opportunity to ask questions and all were answered. The patient agreed with the plan and demonstrated an understanding of the instructions.   The patient was advised to call back or seek an in-person evaluation if the symptoms worsen or if the condition fails to improve as anticipated.  I provided 30 minutes of non-face-to-face time during this encounter.   Juliene GORMAN Patee, LCSW   Participation Level: Active  Behavioral Response: CasualAlertAnxious and Depressed  Type of Therapy: Individual Therapy  Treatment Goals addressed:  Active     Depression CCP Problem  1 Adjustment disorder       walk 3 x weekly  (Completed/Met)     Start:  06/29/21    Expected End:  12/30/22    Resolved:  10/12/22    Goal Note     Pt self reports working and walking 3 to 4 days per week.          Decrease GAD-7 below 5  (Completed/Met)     Start:  06/29/21    Expected End:  12/31/21    Resolved:  09/14/21      LTG: Ralph WILL SCORE LESS THAN 10 ON THE PATIENT HEALTH QUESTIONNAIRE (PHQ-9) (Completed/Met)     Start:  06/29/21    Expected End:  12/31/21    Resolved:  09/14/21      STG: Ralph WILL ATTEND AT LEAST 80% OF SCHEDULED GROUP PSYCHOTHERAPY SESSIONS (Progressing)     Start:  06/29/21    Expected End:  12/01/23         STG: Ralph WILL COMPLETE AT LEAST 80% OF ASSIGNED HOMEWORK (Progressing)      Start:  06/29/21    Expected End:  12/01/23         WORK WITH Ralph TO TRACK SYMPTOMS, TRIGGERS AND/OR SKILL USE THROUGH A MOOD CHART, DIARY CARD, OR JOURNAL (Completed)     Start:  06/29/21    End:  08/16/21      ENCOURAGE Ralph [name] TO PARTICIPATE IN RECOVERY PEER SUPPORT ACTIVITIES WEEKLY (Completed)     Start:  06/29/21    End:  08/16/21      Administer the PHQ-9 or MADRS weekly for 4 weeks (Completed)     Start:  06/29/21    End:  09/14/21      PROVIDE Ralph WITH EDUCATIONAL INFORMATION AND READING MATERIAL ON DISSOCIATION, ITS CAUSES, AND SYMPTOMS (Completed)     Start:  06/29/21    End:  08/16/21      WORK WITH Ralph TO IDENTIFY THE MAJOR COMPONENTS OF A RECENT EPISODE OF DEPRESSION: PHYSICAL SYMPTOMS, MAJOR THOUGHTS AND IMAGES, AND MAJOR BEHAVIORS THEY EXPERIENCED (Completed)     Start:  06/29/21    End:  09/14/21        OP Depression     LTG: Save 20 percent of pay checks monthly (Progressing)     Start:  03/21/22    Expected End:  12/01/23  ProgressTowards Goals: Progressing  Interventions: CBT and Motivational Interviewing Suicidal/Homicidal: Nowithout intent/plan  Therapist Response:   Ralph [name] was alert and oriented x 5.  He was pleasant, cooperative, maintained good eye contact.  Patient engaged well in therapy session and was dressed casually.  He presented with anxious mood\affect.  Patient reports that his mental health has been going better.  He reports that increase socialization as evidenced by going to movies with colleagues to go see a new superhero movies for fantastic 4  and superman.  Patient reports that he has been more optimistic on his outlook on life as things have been going better at home and at work.  Ralph [name] states today that the upstairs ac unit got fixed which has been beneficial and made the whole entire house more accessible.  Intervention/plan: LCSW validated feelings in session.  LCSW utilize open-ended  questions.  LCSW utilized psychoanalytic therapy for patient to express thoughts, feelings and emotions and nonjudgmental environment.  LCSW utilized supportive therapy for praise and encouragement.  LCSW encouraged patient to continue increasing socialization outside of work to help decrease depression and anxiety.  Plan: Return again in 8 weeks.  Diagnosis: Adjustment disorder with mixed anxiety and depressed mood  Major depressive disorder, recurrent episode, mild (HCC)  GAD (generalized anxiety disorder)  Collaboration of Care: Other continued individual therapy  Patient/Guardian was advised Release of Information must be obtained prior to any record release in order to collaborate their care with an outside provider. Patient/Guardian was advised if they have not already done so to contact the registration department to sign all necessary forms in order for us  to release information regarding their care.   Consent: Patient/Guardian gives verbal consent for treatment and assignment of benefits for services provided during this visit. Patient/Guardian expressed understanding and agreed to proceed.   Juliene GORMAN Patee, LCSW 11/10/2023

## 2024-01-17 ENCOUNTER — Ambulatory Visit (HOSPITAL_COMMUNITY): Admitting: Licensed Clinical Social Worker
